# Patient Record
Sex: Female | Born: 1940 | Race: White | Hispanic: No | Smoking: Former smoker
Health system: Southern US, Community
[De-identification: ages and names within clinical notes are randomized; demographics above are authoritative.]

## PROBLEM LIST (undated history)

## (undated) DIAGNOSIS — C55 Malignant neoplasm of uterus, part unspecified: Secondary | ICD-10-CM

## (undated) DIAGNOSIS — I10 Essential (primary) hypertension: Secondary | ICD-10-CM

## (undated) DIAGNOSIS — D649 Anemia, unspecified: Secondary | ICD-10-CM

## (undated) DIAGNOSIS — E785 Hyperlipidemia, unspecified: Secondary | ICD-10-CM

## (undated) DIAGNOSIS — I519 Heart disease, unspecified: Secondary | ICD-10-CM

## (undated) DIAGNOSIS — K769 Liver disease, unspecified: Secondary | ICD-10-CM

## (undated) DIAGNOSIS — E079 Disorder of thyroid, unspecified: Secondary | ICD-10-CM

## (undated) HISTORY — DX: Malignant neoplasm of uterus, part unspecified: C55

## (undated) HISTORY — PX: SKIN CANCER EXCISION: SHX779

## (undated) HISTORY — DX: Heart disease, unspecified: I51.9

## (undated) HISTORY — DX: Liver disease, unspecified: K76.9

## (undated) HISTORY — DX: Disorder of thyroid, unspecified: E07.9

## (undated) HISTORY — DX: Anemia, unspecified: D64.9

## (undated) HISTORY — DX: Essential (primary) hypertension: I10

## (undated) HISTORY — PX: CATARACT EXTRACTION: SUR2

## (undated) HISTORY — DX: Hyperlipidemia, unspecified: E78.5

## (undated) HISTORY — PX: OTHER SURGICAL HISTORY: SHX169

---

## 2013-08-16 DIAGNOSIS — K7460 Unspecified cirrhosis of liver: Secondary | ICD-10-CM | POA: Insufficient documentation

## 2013-08-16 DIAGNOSIS — K7581 Nonalcoholic steatohepatitis (NASH): Secondary | ICD-10-CM | POA: Insufficient documentation

## 2013-08-16 DIAGNOSIS — E039 Hypothyroidism, unspecified: Secondary | ICD-10-CM | POA: Insufficient documentation

## 2013-08-16 DIAGNOSIS — I10 Essential (primary) hypertension: Secondary | ICD-10-CM | POA: Insufficient documentation

## 2013-09-27 DIAGNOSIS — R899 Unspecified abnormal finding in specimens from other organs, systems and tissues: Secondary | ICD-10-CM | POA: Insufficient documentation

## 2013-09-27 DIAGNOSIS — K439 Ventral hernia without obstruction or gangrene: Secondary | ICD-10-CM | POA: Insufficient documentation

## 2013-10-24 DIAGNOSIS — R188 Other ascites: Secondary | ICD-10-CM | POA: Insufficient documentation

## 2013-10-24 DIAGNOSIS — I81 Portal vein thrombosis: Secondary | ICD-10-CM | POA: Insufficient documentation

## 2017-12-01 DIAGNOSIS — I2510 Atherosclerotic heart disease of native coronary artery without angina pectoris: Secondary | ICD-10-CM | POA: Insufficient documentation

## 2017-12-01 DIAGNOSIS — R0600 Dyspnea, unspecified: Secondary | ICD-10-CM | POA: Insufficient documentation

## 2017-12-01 DIAGNOSIS — R0609 Other forms of dyspnea: Secondary | ICD-10-CM | POA: Insufficient documentation

## 2018-02-09 DIAGNOSIS — R600 Localized edema: Secondary | ICD-10-CM | POA: Insufficient documentation

## 2018-03-16 DIAGNOSIS — K922 Gastrointestinal hemorrhage, unspecified: Secondary | ICD-10-CM | POA: Insufficient documentation

## 2020-04-09 DIAGNOSIS — C55 Malignant neoplasm of uterus, part unspecified: Secondary | ICD-10-CM | POA: Insufficient documentation

## 2020-08-24 DIAGNOSIS — E871 Hypo-osmolality and hyponatremia: Secondary | ICD-10-CM | POA: Insufficient documentation

## 2020-09-06 DIAGNOSIS — K31819 Angiodysplasia of stomach and duodenum without bleeding: Secondary | ICD-10-CM | POA: Insufficient documentation

## 2020-09-27 DIAGNOSIS — D500 Iron deficiency anemia secondary to blood loss (chronic): Secondary | ICD-10-CM | POA: Insufficient documentation

## 2020-09-27 NOTE — Progress Notes (Signed)
[place]  [phone] Fax:(336) [phone]  ID: April Blake OB: [birth year]  MR#: [id number]  BRA#:[id number]  [name], [name]  CHIEF COMPLAINT: Anemia, unspecified.  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  All of her care has been received at [facility].  Because she requires intermittent blood transfusions, patient wishes to establish care closer to home for treatment.  She currently feels well and is at her baseline.  She has no neurologic complaints.  She denies any recent fevers or illnesses.  She has a fair appetite, but denies weight loss.  She has no chest pain, shortness of breath, cough, or hemoptysis.  She denies any nausea, vomiting, constipation, or diarrhea.  She has noted no recent melena or hematochezia.  She has no urinary complaints.  Patient offers no further specific complaints today.  REVIEW OF SYSTEMS:   Review of Systems  Constitutional:  Positive for malaise/fatigue. Negative for fever and weight loss.  Respiratory: Negative.  Negative for cough, hemoptysis and shortness of breath.   Cardiovascular: Negative.  Negative for chest pain and leg swelling.  Gastrointestinal: Negative.  Negative for abdominal pain, blood in stool and melena.  Genitourinary: Negative.  Negative for dysuria and hematuria.  Musculoskeletal: Negative.  Negative for back pain.  Skin: Negative.  Negative for rash.  Neurological:  Positive for weakness. Negative for dizziness, focal weakness and headaches.  Psychiatric/Behavioral: Negative.  The patient is not nervous/anxious.    As per HPI. Otherwise, a complete review of systems is negative.  PAST MEDICAL HISTORY: No past medical history on file.  PAST SURGICAL HISTORY: Reviewed and unchanged.  FAMILY HISTORY: No family history on  file.  ADVANCED DIRECTIVES (Y/N):  N  HEALTH MAINTENANCE: Social History   Tobacco Use   Smoking status: Former    Pack years: 0.00    Types: Cigarettes  Substance Use Topics   Alcohol use: Never   Drug use: Never     Colonoscopy:  PAP:  Bone density:  Lipid panel:  No Known Allergies  Current Outpatient Medications  Medication Sig Dispense Refill   ACCU-CHEK GUIDE test strip 1 each 4 (four) times daily.     acetaminophen (TYLENOL) 500 MG tablet Take by mouth.     albuterol (VENTOLIN HFA) 108 (90 Base) MCG/ACT inhaler 2 puffs as needed     bisacodyl (DULCOLAX) 5 MG EC tablet Take by mouth.     Budeson-Glycopyrrol-Formoterol (BREZTRI AEROSPHERE) 160-9-4.8 MCG/ACT AERO 2 puffs     Coenzyme Q10 (CO Q-10) 200 MG CAPS 1 capsule with a meal     Cysteamine Bitartrate (PROCYSBI) 300 MG PACK Use 1 each 3 (three) times daily Use as instructed.     ferrous sulfate 325 (65 FE) MG tablet 1 tablet     furosemide (LASIX) 20 MG tablet Take by mouth.     glucose blood (ACCU-CHEK AVIVA PLUS) test strip      glucose blood (ACCU-CHEK GUIDE) test strip Check BS 4/day -Fluctuating blood sugars     insulin glargine (LANTUS SOLOSTAR) 100 UNIT/ML Solostar Pen 20 units in the morning     insulin regular (NOVOLIN R) 100 units/mL injection See admin instructions.     levothyroxine (SYNTHROID) 75 MCG tablet 1 tablet in the morning on an empty stomach     omeprazole (PRILOSEC) 40 MG capsule 1 capsule 30 minutes before morning meal  ondansetron (ZOFRAN) 4 MG tablet 1 tablet as needed for nausea/vomiting     polyethylene glycol powder (GLYCOLAX/MIRALAX) 17 GM/SCOOP powder Take by mouth.     propranolol (INDERAL) 10 MG tablet 1 tablet on an empty stomach     simvastatin (ZOCOR) 20 MG tablet 1 tablet in the evening     spironolactone (ALDACTONE) 50 MG tablet Take by mouth.     traMADol (ULTRAM) 50 MG tablet 1-2 tablets as needed for pain     No current facility-administered medications for this  visit.    OBJECTIVE: Vitals:   09/30/20 1541  Resp: 20  Temp: (!) 97.3 F (36.3 C)  SpO2: 100%     There is no height or weight on file to calculate BMI.    ECOG FS:1 - Symptomatic but completely ambulatory  General: Well-developed, well-nourished, no acute distress. Eyes: Pink conjunctiva, anicteric sclera. HEENT: Normocephalic, moist mucous membranes. Lungs: No audible wheezing or coughing. Heart: Regular rate and rhythm. Abdomen: Soft, nontender, no obvious distention. Musculoskeletal: No edema, cyanosis, or clubbing. Neuro: Alert, answering all questions appropriately. Cranial nerves grossly intact. Skin: No rashes or petechiae noted. Psych: Normal affect. Lymphatics: No cervical, calvicular, axillary or inguinal LAD.   LAB RESULTS:  No results found for: NA, K, CL, CO2, GLUCOSE, BUN, CREATININE, CALCIUM, PROT, ALBUMIN, AST, ALT, ALKPHOS, BILITOT, GFRNONAA, GFRAA  Lab Results  Component Value Date   WBC 4.0 09/30/2020   NEUTROABS 3.1 09/30/2020   HGB 6.0 (L) 09/30/2020   HCT 19.6 (L) 09/30/2020   MCV 93.8 09/30/2020   PLT 145 (L) 09/30/2020   Lab Results  Component Value Date   IRON 34 09/30/2020   TIBC 437 09/30/2020   IRONPCTSAT 8 (L) 09/30/2020   Lab Results  Component Value Date   FERRITIN 19 09/30/2020     STUDIES: No results found.  ASSESSMENT: Anemia, unspecified.   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  We will continue to monitor patient's hemoglobin periodically and transfuse if falls below 7.0.  If patient's hemoglobin is between 7.0-8.0 we will base transfusion on patient's performance status.  If greater than 8.0, no transfusion will be necessary.  Her most recent hemoglobin is 6.0, therefore she will return to clinic tomorrow for 2 units of packed red blood cells.  Return to clinic monthly for laboratory work and consideration of transfusion.  Patient then return to clinic in 3 months for laboratory work, further  evaluation, and continuation of treatment if needed. NASH/GAVE syndrome: Continue follow-up and treatment at [facility].  I spent a total of 60 minutes reviewing chart data, face-to-face evaluation with the patient, counseling and coordination of care as detailed above.   Patient expressed understanding and was in agreement with this plan. She also understands that She can call clinic at any time with any questions, concerns, or complaints.    Lloyd Huger, MD   10/02/2020 4:58 PM

## 2020-09-30 ENCOUNTER — Encounter: Payer: Self-pay | Admitting: Oncology

## 2020-09-30 ENCOUNTER — Other Ambulatory Visit: Payer: Self-pay

## 2020-09-30 ENCOUNTER — Encounter (INDEPENDENT_AMBULATORY_CARE_PROVIDER_SITE_OTHER): Payer: Self-pay

## 2020-09-30 ENCOUNTER — Inpatient Hospital Stay: Payer: Medicare Other

## 2020-09-30 ENCOUNTER — Inpatient Hospital Stay: Payer: Medicare Other | Attending: Oncology | Admitting: Oncology

## 2020-09-30 DIAGNOSIS — K429 Umbilical hernia without obstruction or gangrene: Secondary | ICD-10-CM | POA: Insufficient documentation

## 2020-09-30 DIAGNOSIS — D649 Anemia, unspecified: Secondary | ICD-10-CM | POA: Diagnosis present

## 2020-09-30 DIAGNOSIS — M1710 Unilateral primary osteoarthritis, unspecified knee: Secondary | ICD-10-CM | POA: Insufficient documentation

## 2020-09-30 DIAGNOSIS — K7581 Nonalcoholic steatohepatitis (NASH): Secondary | ICD-10-CM | POA: Diagnosis not present

## 2020-09-30 DIAGNOSIS — R0989 Other specified symptoms and signs involving the circulatory and respiratory systems: Secondary | ICD-10-CM | POA: Insufficient documentation

## 2020-09-30 DIAGNOSIS — I8501 Esophageal varices with bleeding: Secondary | ICD-10-CM | POA: Insufficient documentation

## 2020-09-30 DIAGNOSIS — Z87891 Personal history of nicotine dependence: Secondary | ICD-10-CM | POA: Diagnosis not present

## 2020-09-30 DIAGNOSIS — K7460 Unspecified cirrhosis of liver: Secondary | ICD-10-CM | POA: Diagnosis not present

## 2020-09-30 DIAGNOSIS — G4700 Insomnia, unspecified: Secondary | ICD-10-CM | POA: Insufficient documentation

## 2020-09-30 DIAGNOSIS — E785 Hyperlipidemia, unspecified: Secondary | ICD-10-CM | POA: Insufficient documentation

## 2020-09-30 DIAGNOSIS — E1121 Type 2 diabetes mellitus with diabetic nephropathy: Secondary | ICD-10-CM | POA: Insufficient documentation

## 2020-09-30 DIAGNOSIS — E1169 Type 2 diabetes mellitus with other specified complication: Secondary | ICD-10-CM | POA: Insufficient documentation

## 2020-09-30 DIAGNOSIS — M179 Osteoarthritis of knee, unspecified: Secondary | ICD-10-CM | POA: Insufficient documentation

## 2020-09-30 DIAGNOSIS — Z6841 Body Mass Index (BMI) 40.0 and over, adult: Secondary | ICD-10-CM | POA: Insufficient documentation

## 2020-09-30 DIAGNOSIS — Z860100 Personal history of colon polyps, unspecified: Secondary | ICD-10-CM | POA: Insufficient documentation

## 2020-09-30 DIAGNOSIS — Z7409 Other reduced mobility: Secondary | ICD-10-CM | POA: Insufficient documentation

## 2020-09-30 DIAGNOSIS — R5383 Other fatigue: Secondary | ICD-10-CM | POA: Insufficient documentation

## 2020-09-30 DIAGNOSIS — R928 Other abnormal and inconclusive findings on diagnostic imaging of breast: Secondary | ICD-10-CM | POA: Insufficient documentation

## 2020-09-30 DIAGNOSIS — R269 Unspecified abnormalities of gait and mobility: Secondary | ICD-10-CM | POA: Insufficient documentation

## 2020-09-30 DIAGNOSIS — N1832 Chronic kidney disease, stage 3b: Secondary | ICD-10-CM | POA: Insufficient documentation

## 2020-09-30 LAB — CBC WITH DIFFERENTIAL/PLATELET
Abs Immature Granulocytes: 0.02 10*3/uL (ref 0.00–0.07)
Basophils Absolute: 0 10*3/uL (ref 0.0–0.1)
Basophils Relative: 1 %
Eosinophils Absolute: 0.2 10*3/uL (ref 0.0–0.5)
Eosinophils Relative: 4 %
HCT: 19.6 % — ABNORMAL LOW (ref 36.0–46.0)
Hemoglobin: 6 g/dL — ABNORMAL LOW (ref 12.0–15.0)
Immature Granulocytes: 1 %
Lymphocytes Relative: 7 %
Lymphs Abs: 0.3 10*3/uL — ABNORMAL LOW (ref 0.7–4.0)
MCH: 28.7 pg (ref 26.0–34.0)
MCHC: 30.6 g/dL (ref 30.0–36.0)
MCV: 93.8 fL (ref 80.0–100.0)
Monocytes Absolute: 0.4 10*3/uL (ref 0.1–1.0)
Monocytes Relative: 11 %
Neutro Abs: 3.1 10*3/uL (ref 1.7–7.7)
Neutrophils Relative %: 76 %
Platelets: 145 10*3/uL — ABNORMAL LOW (ref 150–400)
RBC: 2.09 MIL/uL — ABNORMAL LOW (ref 3.87–5.11)
RDW: 16.1 % — ABNORMAL HIGH (ref 11.5–15.5)
WBC: 4 10*3/uL (ref 4.0–10.5)
nRBC: 0 % (ref 0.0–0.2)

## 2020-09-30 LAB — IRON AND TIBC
Iron: 34 ug/dL (ref 28–170)
Saturation Ratios: 8 % — ABNORMAL LOW (ref 10.4–31.8)
TIBC: 437 ug/dL (ref 250–450)
UIBC: 403 ug/dL

## 2020-09-30 LAB — SAMPLE TO BLOOD BANK

## 2020-09-30 LAB — FERRITIN: Ferritin: 19 ng/mL (ref 11–307)

## 2020-09-30 NOTE — Progress Notes (Signed)
Patient here today for new evaluation regarding anemia. Patients daughter reports she needs blood transfusion every 3 months, hgb typically 7.5-7.9.

## 2020-10-01 ENCOUNTER — Telehealth: Payer: Self-pay | Admitting: *Deleted

## 2020-10-01 ENCOUNTER — Other Ambulatory Visit: Payer: Self-pay | Admitting: Oncology

## 2020-10-01 DIAGNOSIS — D649 Anemia, unspecified: Secondary | ICD-10-CM

## 2020-10-01 NOTE — Addendum Note (Signed)
Addended by: Vanice Sarah on: 10/01/2020 02:53 PM   Modules accepted: Orders

## 2020-10-01 NOTE — Telephone Encounter (Signed)
Ginger (POA) called stating that she was to have gotten a call with patient results and she has not heard from [place]   CBC with Differential/Platelet Order: [id number] Status: Final result   Visible to patient: No (inaccessible in MyChart)   Next appt: 10/14/2020 at 10:00 AM in Internal Medicine Nobie Putnam, DO)   Dx: Anemia, unspecified type   0 Result Notes  Component Ref Range & Units 1 d ago   WBC 4.0 - 10.5 K/uL 4.0   RBC 3.87 - 5.11 MIL/uL 2.09 Low    Hemoglobin 12.0 - 15.0 g/dL 6.0 Low    HCT 36.0 - 46.0 % 19.6 Low    MCV 80.0 - 100.0 fL 93.8   MCH 26.0 - 34.0 pg 28.7   MCHC 30.0 - 36.0 g/dL 30.6   RDW 11.5 - 15.5 % 16.1 High    Platelets 150 - 400 K/uL 145 Low    nRBC 0.0 - 0.2 % 0.0   Neutrophils Relative % % 76   Neutro Abs 1.7 - 7.7 K/uL 3.1   Lymphocytes Relative % 7   Lymphs Abs 0.7 - 4.0 K/uL 0.3 Low    Monocytes Relative % 11   Monocytes Absolute 0.1 - 1.0 K/uL 0.4   Eosinophils Relative % 4   Eosinophils Absolute 0.0 - 0.5 K/uL 0.2   Basophils Relative % 1   Basophils Absolute 0.0 - 0.1 K/uL 0.0   Immature Granulocytes % 1   Abs Immature Granulocytes 0.00 - 0.07 K/uL 0.02   Comment: Performed at [facility], [address] [place], [place] [address]  Resulting Agency  [facility]         Specimen Collected: 09/30/20 16:07 Last Resulted: 09/30/20 16:30      Lab Flowsheet    Order Details    View Encounter    Lab and Collection Details    Routing    Result History    View Encounter Conversation         Result Care Coordination    Patient Communication   Add Comments   Not seen Back to Top        Other Results from 09/30/2020    Contains abnormal data Iron and TIBC Order: [id number] Status: Final result   Visible to patient: No (inaccessible in MyChart)   Next appt: 10/14/2020 at 10:00 AM in Internal Medicine Nobie Putnam, DO)   Dx: Anemia, unspecified type   0 Result Notes  Component Ref Range & Units 1 d  ago   Iron 28 - 170 ug/dL 34   TIBC 250 - 450 ug/dL 437   Saturation Ratios 10.4 - 31.8 % 8 Low    UIBC ug/dL 403   Comment: Performed at [facility], [address] [place], [place] [address]  Resulting Agency  [facility] LAB         Specimen Collected: 09/30/20 16:07 Last Resulted: 09/30/20 17:39      Lab Flowsheet    Order Details    View Encounter    Lab and Collection Details    Routing    Result History    View Encounter Conversation         Result Care Coordination    Patient Communication   Add Comments   Not seen Back to Top         Ferritin Order: [id number] Status: Final result    Visible to patient: No (inaccessible in [place])    Next appt: 10/14/2020 at 10:00  AM in Internal Medicine Nobie Putnam, DO)    Dx: Anemia, unspecified type    0 Result Notes   Component Ref Range & Units 1 d ago   Ferritin 11 - 307 ng/mL 19   Comment: Performed at [facility], [address] [place], [place] [address]  Resulting Agency  [facility]          Specimen Collected: 09/30/20 16:07 Last Resulted: 09/30/20 17:39      Lab Flowsheet     Order Details     View Encounter     Lab and Collection Details     Routing     Result History     View Encounter Conversation         Result Care Coordination    Patient Communication   Add Comments   Not seen Back to Top          Hold Tube- Blood Bank Order: [id number] Status: Final result    Visible to patient: No (inaccessible in MyChart)    Next appt: 10/14/2020 at 10:00 AM in Internal Medicine Nobie Putnam, DO)    Dx: Anemia, unspecified type    0 Result Notes   Component 1 d ago   Blood Bank Specimen SAMPLE AVAILABLE FOR TESTING   Sample Expiration 10/03/2020,2359  Performed at [facility], [address] [place], [place] [address]   Resulting Agency [facility] LAB          Specimen Collected: 09/30/20 16:07 Last Resulted:  09/30/20 18:04

## 2020-10-01 NOTE — Telephone Encounter (Signed)
Patient needs 2 units of blood, we drew hold tube yesterday. When can she get blood this week?

## 2020-10-01 NOTE — Addendum Note (Signed)
Addended by: Vanice Sarah on: 10/01/2020 04:01 PM   Modules accepted: Orders

## 2020-10-01 NOTE — Telephone Encounter (Signed)
Patient is scheduled for blood transfusion tomorrow @ 9:00.  Ginger is aware and advised her to make sure pt does NOT remove the blood arm band that was placed yesterday.

## 2020-10-02 ENCOUNTER — Other Ambulatory Visit: Payer: Self-pay

## 2020-10-02 ENCOUNTER — Inpatient Hospital Stay: Payer: Medicare Other

## 2020-10-02 DIAGNOSIS — D649 Anemia, unspecified: Secondary | ICD-10-CM | POA: Diagnosis not present

## 2020-10-02 LAB — ABO/RH: ABO/RH(D): A POS

## 2020-10-02 LAB — PREPARE RBC (CROSSMATCH)

## 2020-10-02 MED ORDER — SODIUM CHLORIDE 0.9% IV SOLUTION
250.0000 mL | Freq: Once | INTRAVENOUS | Status: AC
  Administered 2020-10-02: 250 mL via INTRAVENOUS
  Filled 2020-10-02: qty 250

## 2020-10-02 MED ORDER — DIPHENHYDRAMINE HCL 50 MG/ML IJ SOLN
25.0000 mg | Freq: Once | INTRAMUSCULAR | Status: AC
  Administered 2020-10-02: 25 mg via INTRAVENOUS
  Filled 2020-10-02: qty 1

## 2020-10-02 MED ORDER — ACETAMINOPHEN 325 MG PO TABS
650.0000 mg | ORAL_TABLET | Freq: Once | ORAL | Status: AC
  Administered 2020-10-02: 650 mg via ORAL
  Filled 2020-10-02: qty 2

## 2020-10-02 NOTE — Patient Instructions (Signed)
https://www.redcrossblood.org/donate-blood/blood-donation-process/what-happens-to-donated-blood/blood-transfusions/types-of-blood-transfusions.html"> https://www.hematology.org/education/patients/blood-basics/blood-safety-and-matching"> https://www.nhlbi.nih.gov/health-topics/blood-transfusion">  Blood Transfusion, Adult A blood transfusion is a procedure in which you receive blood or a type of blood cell (blood component) through an IV. You may need a blood transfusion when your blood level is low. This may result from a bleeding disorder, illness, injury, or surgery. The blood may come from a donor. You may also be able to donate blood for yourself (autologous blood donation) before a planned surgery. The blood given in a transfusion is made up of different blood components. You may receive: Red blood cells. These carry oxygen to the cells in the body. Platelets. These help your blood to clot. Plasma. This is the liquid part of your blood. It carries proteins and other substances throughout the body. White blood cells. These help you fight infections. If you have hemophilia or another clotting disorder, you may also receive othertypes of blood products. Tell a health care provider about: Any blood disorders you have. Any previous reactions you have had during a blood transfusion. Any allergies you have. All medicines you are taking, including vitamins, herbs, eye drops, creams, and over-the-counter medicines. Any surgeries you have had. Any medical conditions you have, including any recent fever or cold symptoms. Whether you are pregnant or may be pregnant. What are the risks? Generally, this is a safe procedure. However, problems may occur. The most common problems include: A mild allergic reaction, such as red, swollen areas of skin (hives) and itching. Fever or chills. This may be the body's response to new blood cells received. This may occur during or up to 4 hours after the  transfusion. More serious problems may include: Transfusion-associated circulatory overload (TACO), or too much fluid in the lungs. This may cause breathing problems. A serious allergic reaction, such as difficulty breathing or swelling around the face and lips. Transfusion-related acute lung injury (TRALI), which causes breathing difficulty and low oxygen in the blood. This can occur within hours of the transfusion or several days later. Iron overload. This can happen after receiving many blood transfusions over a period of time. Infection or virus being transmitted. This is rare because donated blood is carefully tested before it is given. Hemolytic transfusion reaction. This is rare. It happens when your body's defense system (immune system)tries to attack the new blood cells. Symptoms may include fever, chills, nausea, low blood pressure, and low back or chest pain. Transfusion-associated graft-versus-host disease (TAGVHD). This is rare. It happens when donated cells attack your body's healthy tissues. What happens before the procedure? Medicines Ask your health care provider about: Changing or stopping your regular medicines. This is especially important if you are taking diabetes medicines or blood thinners. Taking medicines such as aspirin and ibuprofen. These medicines can thin your blood. Do not take these medicines unless your health care provider tells you to take them. Taking over-the-counter medicines, vitamins, herbs, and supplements. General instructions Follow instructions from your health care provider about eating and drinking restrictions. You will have a blood test to determine your blood type. This is necessary to know what kind of blood your body will accept and to match it to the donor blood. If you are going to have a planned surgery, you may be able to do an autologous blood donation. This may be done in case you need to have a transfusion. You will have your temperature,  blood pressure, and pulse monitored before the transfusion. If you have had an allergic reaction to a transfusion in the past, you may be given   medicine to help prevent a reaction. This medicine may be given to you by mouth (orally) or through an IV. Set aside time for the blood transfusion. This procedure generally takes 1-4 hours to complete. What happens during the procedure?  An IV will be inserted into one of your veins. The bag of donated blood will be attached to your IV. The blood will then enter through your vein. Your temperature, blood pressure, and pulse will be monitored regularly during the transfusion. This monitoring is done to detect early signs of a transfusion reaction. Tell your nurse right away if you have any of these symptoms during the transfusion: Shortness of breath or trouble breathing. Chest or back pain. Fever or chills. Hives or itching. If you have any signs or symptoms of a reaction, your transfusion will be stopped and you may be given medicine. When the transfusion is complete, your IV will be removed. Pressure may be applied to the IV site for a few minutes. A bandage (dressing)will be applied. The procedure may vary among health care providers and hospitals. What happens after the procedure? Your temperature, blood pressure, pulse, breathing rate, and blood oxygen level will be monitored until you leave the hospital or clinic. Your blood may be tested to see how you are responding to the transfusion. You may be warmed with fluids or blankets to maintain a normal body temperature. If you receive your blood transfusion in an outpatient setting, you will be told whom to contact to report any reactions. Where to find more information For more information on blood transfusions, visit the American Red Cross: redcross.org Summary A blood transfusion is a procedure in which you receive blood or a type of blood cell (blood component) through an IV. The blood you  receive may come from a donor or be donated by yourself (autologous blood donation) before a planned surgery. The blood given in a transfusion is made up of different blood components. You may receive red blood cells, platelets, plasma, or white blood cells depending on the condition treated. Your temperature, blood pressure, and pulse will be monitored before, during, and after the transfusion. After the transfusion, your blood may be tested to see how your body has responded. This information is not intended to replace advice given to you by your health care provider. Make sure you discuss any questions you have with your healthcare provider. Document Revised: 01/25/2019 Document Reviewed: 09/14/2018 Elsevier Patient Education  2022 [employer]  

## 2020-10-03 LAB — BPAM RBC
Blood Product Expiration Date: 202208012359
Blood Product Expiration Date: 202208012359
ISSUE DATE / TIME: 202206301007
ISSUE DATE / TIME: 202206301219
Unit Type and Rh: 6200
Unit Type and Rh: 6200

## 2020-10-03 LAB — TYPE AND SCREEN
ABO/RH(D): A POS
Antibody Screen: NEGATIVE
Unit division: 0
Unit division: 0

## 2020-10-14 ENCOUNTER — Other Ambulatory Visit: Payer: Self-pay

## 2020-10-14 ENCOUNTER — Encounter: Payer: Self-pay | Admitting: Family Medicine

## 2020-10-14 ENCOUNTER — Ambulatory Visit (INDEPENDENT_AMBULATORY_CARE_PROVIDER_SITE_OTHER): Payer: Medicare Other | Admitting: Family Medicine

## 2020-10-14 VITALS — BP 119/42 | HR 64 | Ht 63.0 in | Wt 174.6 lb

## 2020-10-14 DIAGNOSIS — E1121 Type 2 diabetes mellitus with diabetic nephropathy: Secondary | ICD-10-CM | POA: Diagnosis not present

## 2020-10-14 DIAGNOSIS — N1832 Chronic kidney disease, stage 3b: Secondary | ICD-10-CM

## 2020-10-14 DIAGNOSIS — R600 Localized edema: Secondary | ICD-10-CM

## 2020-10-14 DIAGNOSIS — E785 Hyperlipidemia, unspecified: Secondary | ICD-10-CM

## 2020-10-14 DIAGNOSIS — I8511 Secondary esophageal varices with bleeding: Secondary | ICD-10-CM

## 2020-10-14 DIAGNOSIS — C541 Malignant neoplasm of endometrium: Secondary | ICD-10-CM

## 2020-10-14 DIAGNOSIS — K7581 Nonalcoholic steatohepatitis (NASH): Secondary | ICD-10-CM

## 2020-10-14 DIAGNOSIS — I10 Essential (primary) hypertension: Secondary | ICD-10-CM | POA: Diagnosis not present

## 2020-10-14 DIAGNOSIS — E039 Hypothyroidism, unspecified: Secondary | ICD-10-CM

## 2020-10-14 DIAGNOSIS — D500 Iron deficiency anemia secondary to blood loss (chronic): Secondary | ICD-10-CM

## 2020-10-14 DIAGNOSIS — M170 Bilateral primary osteoarthritis of knee: Secondary | ICD-10-CM

## 2020-10-14 DIAGNOSIS — E1169 Type 2 diabetes mellitus with other specified complication: Secondary | ICD-10-CM

## 2020-10-14 DIAGNOSIS — K31819 Angiodysplasia of stomach and duodenum without bleeding: Secondary | ICD-10-CM

## 2020-10-14 DIAGNOSIS — K7460 Unspecified cirrhosis of liver: Secondary | ICD-10-CM

## 2020-10-14 DIAGNOSIS — K429 Umbilical hernia without obstruction or gangrene: Secondary | ICD-10-CM

## 2020-10-14 LAB — POCT UA - MICROALBUMIN: Microalbumin Ur, POC: NEGATIVE mg/L

## 2020-10-14 MED ORDER — TRAMADOL HCL 50 MG PO TABS: 50.0000 mg | ORAL_TABLET | Freq: Every evening | ORAL | 1 refills | Status: DC | PRN

## 2020-10-14 MED ORDER — DICLOFENAC SODIUM 1 % EX GEL: 2.0000 g | Freq: Four times a day (QID) | CUTANEOUS | 2 refills | Status: AC | PRN

## 2020-10-14 MED ORDER — SIMVASTATIN 20 MG PO TABS: 20.0000 mg | ORAL_TABLET | Freq: Every day | ORAL | 3 refills | Status: DC

## 2020-10-14 NOTE — Progress Notes (Signed)
Subjective:    Patient ID: April Blake, female    DOB: 1941/03/21, 80 y.o.   MRN: [id number]  April Blake is a 80 y.o. female presenting on 10/14/2020 for [place] to [facility] to live with daughter Manuela Schwartz in November for holidays.  Here with daughter, Ginger.  HPI  History of hernia abnormality, ultimately found Uterine Cancer, and cyst on ovary.  [facility] - Dr Shary Key Radiation Oncology - Dr Deliah Goody Liver/Hepatology - Dr Merrilee Jansky  History since 2007 with esophageal varices, and liver cirrhosis. Lower extremity edema On Lasix 60mg  daily and Spironolactone 100mg  daily with good results on fluid.  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 Usually will get 2 PRBC units every 3 months or sooner.  Type 2 Diabetes, chronic For >20 years, on Insulin regimen currently Awaiting new apt w/ Dr Gabriel Carina [facility] Endocrinology 10/24/20 On Lantus 20u daily and Novolin R SSI History of cataract, hyperglycemia, and CKD-III  Osteoarthritis, bilateral knees Chronic pain On Tramadol in past for pain episodic flare up, usually occasionally at night to help sleep with knee and joint pain In wheelchair PRN Needs handicap placard form signed.  Hyperlipidemia Needs re order Simvastatin.  Hypothyroidism On chronic levothyroxine Levothyroxine 55mcg daily  Depression screen [facility] 2/9 10/14/2020  Decreased Interest 0  Down, Depressed, Hopeless 0  PHQ - 2 Score 0    Past Medical History:  Diagnosis Date   Anemia    Heart disease    Hyperlipidemia    Hypertension    Liver disease    Thyroid disease    Uterine cancer ([place])    Past Surgical History:  Procedure Laterality Date   CATARACT EXTRACTION     cataract surg     SKIN CANCER EXCISION     skin cancer removal     surgery to left leg     Social History   Socioeconomic History   Marital status: Widowed    Spouse name: Not on file    Number of children: Not on file   Years of education: Not on file   Highest education level: Not on file  Occupational History   Not on file  Tobacco Use   Smoking status: Former    Packs/day: 0.20    Years: 5.00    Pack years: 1.00    Types: Cigarettes   Smokeless tobacco: Never  Substance and Sexual Activity   Alcohol use: Never   Drug use: Never   Sexual activity: Not on file  Other Topics Concern   Not on file  Social History Narrative   Not on file   Social Determinants of Health   Financial Resource Strain: Not on file  Food Insecurity: Not on file  Transportation Needs: Not on file  Physical Activity: Not on file  Stress: Not on file  Social Connections: Not on file  Intimate Partner Violence: Not on file   History reviewed. No pertinent family history. Current Outpatient Medications on File Prior to Visit  Medication Sig   ACCU-CHEK GUIDE test strip 1 each 4 (four) times daily.   bisacodyl (DULCOLAX) 5 MG EC tablet Take by mouth.   Budeson-Glycopyrrol-Formoterol (BREZTRI AEROSPHERE) 160-9-4.8 MCG/ACT AERO 2 puffs   Coenzyme Q10 (CO Q-10) 200 MG CAPS 1 capsule with a meal   Cysteamine Bitartrate (PROCYSBI) 300 MG PACK Use 1 each 3 (three) times daily Use as instructed.   ferrous sulfate 325 (  65 FE) MG tablet 1 tablet   furosemide (LASIX) 20 MG tablet Take 60 mg by mouth daily.   glucose blood (ACCU-CHEK AVIVA PLUS) test strip    glucose blood (ACCU-CHEK GUIDE) test strip Check BS 4/day -Fluctuating blood sugars   insulin glargine (LANTUS SOLOSTAR) 100 UNIT/ML Solostar Pen 20 units in the morning   insulin regular (NOVOLIN R) 100 units/mL injection See admin instructions.   levothyroxine (SYNTHROID) 75 MCG tablet 1 tablet in the morning on an empty stomach   omeprazole (PRILOSEC) 40 MG capsule Take 40 mg by mouth 2 (two) times daily before a meal.   polyethylene glycol powder (GLYCOLAX/MIRALAX) 17 GM/SCOOP powder Take by mouth.   propranolol (INDERAL) 10 MG  tablet 1 tablet on an empty stomach   spironolactone (ALDACTONE) 50 MG tablet Take 100 mg by mouth daily.   acetaminophen (TYLENOL) 500 MG tablet Take by mouth. (Patient not taking: Reported on 10/14/2020)   albuterol (VENTOLIN HFA) 108 (90 Base) MCG/ACT inhaler 2 puffs as needed (Patient not taking: Reported on 10/14/2020)   ondansetron (ZOFRAN) 4 MG tablet 1 tablet as needed for nausea/vomiting (Patient not taking: Reported on 10/14/2020)   No current facility-administered medications on file prior to visit.    Review of Systems Per HPI unless specifically indicated above      Objective:    BP (!) 119/42   Pulse 64   Ht 5\' 3"  (1.6 m)   Wt 174 lb 9.6 oz (79.2 kg)   SpO2 100%   BMI 30.93 kg/m   Wt Readings from Last 3 Encounters:  10/14/20 174 lb 9.6 oz (79.2 kg)  09/30/20 185 lb 8 oz (84.1 kg)     Physical Exam Vitals and nursing note reviewed.  Constitutional:      General: She is not in acute distress.    Appearance: She is well-developed. She is not diaphoretic.     Comments: Well-appearing, comfortable, cooperative  HENT:     Head: Normocephalic and atraumatic.  Eyes:     General:        Right eye: No discharge.        Left eye: No discharge.     Conjunctiva/sclera: Conjunctivae normal.  Neck:     Thyroid: No thyromegaly.  Cardiovascular:     Rate and Rhythm: Normal rate and regular rhythm.     Heart sounds: Normal heart sounds. No murmur heard. Pulmonary:     Effort: Pulmonary effort is normal. No respiratory distress.     Breath sounds: Normal breath sounds. No wheezing or rales.  Musculoskeletal:        General: Normal range of motion.     Cervical back: Normal range of motion and neck supple.     Right lower leg: Edema (+1) present.     Left lower leg: Edema (+1) present.     Comments: In wheelchair  Lymphadenopathy:     Cervical: No cervical adenopathy.  Skin:    General: Skin is warm and dry.     Findings: No erythema or rash.  Neurological:      Mental Status: She is alert and oriented to person, place, and time.  Psychiatric:        Behavior: Behavior normal.     Comments: Well groomed, good eye contact, normal speech and thoughts    Diabetic Foot Exam - Simple   Simple Foot Form Diabetic Foot exam was performed with the following findings: Yes 10/14/2020 10:52 AM  Visual Inspection No deformities, no ulcerations,  no other skin breakdown bilaterally: Yes Sensation Testing Intact to touch and monofilament testing bilaterally: Yes Pulse Check Posterior Tibialis and Dorsalis pulse intact bilaterally: Yes Comments      Results for orders placed or performed in visit on 10/14/20  POCT UA - Microalbumin  Result Value Ref Range   Microalbumin Ur, POC Negative mg/L   Creatinine, POC     Albumin/Creatinine Ratio, Urine, POC        Assessment & Plan:   Problem List Items Addressed This Visit     Uterine cancer ([place])   Umbilical hernia   Type 2 diabetes with nephropathy (HCC)   Relevant Medications   simvastatin (ZOCOR) 20 MG tablet   Other Relevant Orders   POCT UA - Microalbumin (Completed)   Liver cirrhosis secondary to NASH ([place])   Hypothyroidism   Hyperlipidemia associated with type 2 diabetes mellitus (HCC)   Relevant Medications   simvastatin (ZOCOR) 20 MG tablet   GAVE (gastric antral vascular ectasia)   Relevant Medications   simvastatin (ZOCOR) 20 MG tablet   furosemide (LASIX) 20 MG tablet   Essential hypertension - Primary   Relevant Medications   simvastatin (ZOCOR) 20 MG tablet   furosemide (LASIX) 20 MG tablet   Esophageal varices with bleeding (HCC)   Relevant Medications   simvastatin (ZOCOR) 20 MG tablet   furosemide (LASIX) 20 MG tablet   Chronic kidney disease, stage 3b (HCC)   Chronic blood loss anemia   Bilateral lower extremity edema   Other Visit Diagnoses     Bilateral primary osteoarthritis of knee       Relevant Medications   traMADol (ULTRAM) 50 MG tablet   diclofenac Sodium  (VOLTAREN) 1 % GEL       Cirrhosis Esophageal Varices Followed by Dr Guerry Minors Hepatology On med regimen Furosemide/Spironolactone  Type 2 DM Complications CKD-IIIb Cataracts New apt w/ Dr Gabriel Carina [facility] Endocrine 7/22 Continue Lantus 20u and SSI Novolin R regimen no change today DM Foot check Urine Microalbumin POC today Will request copy of DM eye last exam  Osteoarthritis bilateral knee Chronic Joint Pain Reviewed PDMP Agree to renew existing Tramadol rx PRN use only not regular use, 30 pill for few months Trial topical Voltaren, safe for topical use Follow-up as needed  Uterine Cancer S/p Radiation therapy On Surveillance for further management Not candidate for surgery previously  Chronic Blood Loss Anemia GAVE Followed by Dr Grayland Ormond [facility] Heme/Onc Receives PRBC Transfusion when indicated.  Hypothyroidism On Levothyroxine  HLD On Simvastatin, refill  Meds ordered this encounter  Medications   simvastatin (ZOCOR) 20 MG tablet    Sig: Take 1 tablet (20 mg total) by mouth at bedtime.    Dispense:  90 tablet    Refill:  3   traMADol (ULTRAM) 50 MG tablet    Sig: Take 1 tablet (50 mg total) by mouth at bedtime as needed for moderate pain.    Dispense:  30 tablet    Refill:  1   diclofenac Sodium (VOLTAREN) 1 % GEL    Sig: Apply 2 g topically 4 (four) times daily as needed (osteoarthritis knee pain).    Dispense:  100 g    Refill:  2      Follow up plan: Return in about 6 months (around 04/16/2021) for Follow-up 6 month AM apt, fasting can do labs (f/u DM, HLD, Heme/Onc/Liver updates).  Nobie Putnam, [facility] 10/14/2020, 10:29 AM

## 2020-10-14 NOTE — Patient Instructions (Addendum)
Thank you for coming to the office today.  Keep up the good work.  Refilled Simvastatin, Tramadol  Sent generic Voltaren to pharmacy  START anti inflammatory topical - OTC Voltaren (generic Diclofenac) topical 2-4 times a day as needed for pain swelling of affected joint for 1-2 weeks or longer.  Urine test today for protein.  Try to get [place] a copy of last diabetic eye exam faxed [phone]   Please schedule a Follow-up Appointment to: Return in about 6 months (around 04/16/2021) for Follow-up 6 month AM apt, fasting can do labs (f/u DM, HLD, Heme/Onc/Liver updates).  If you have any other questions or concerns, please feel free to call the office or send a message through [place]. You may also schedule an earlier appointment if necessary.  Additionally, you may be receiving a survey about your experience at our office within a few days to 1 week by e-mail or mail. We value your feedback.  Nobie Putnam, DO [place]

## 2020-10-20 ENCOUNTER — Telehealth: Payer: Self-pay | Admitting: Oncology

## 2020-10-20 DIAGNOSIS — D649 Anemia, unspecified: Secondary | ICD-10-CM

## 2020-10-20 NOTE — Telephone Encounter (Signed)
Patients daughter called and states Dr. Grayland Ormond told her to bring her mother in whenever they felt her hemaglobin was getting low.  Patient is coming tomorrow at 915 am.  Routing to clinical team to make sure orders are in.

## 2020-10-20 NOTE — Telephone Encounter (Signed)
Called April Blake for more details of patient's symptoms.  Patient is moving slower, decreased appetite, sleeping more, and "just doesn't feel right".  These are signs and symptoms she had last time she needed a blood transfusion.  Patient was scheduled for labs tomorrow.

## 2020-10-21 ENCOUNTER — Other Ambulatory Visit: Payer: Self-pay | Admitting: Oncology

## 2020-10-21 ENCOUNTER — Inpatient Hospital Stay: Payer: Medicare Other | Attending: Oncology

## 2020-10-21 DIAGNOSIS — D649 Anemia, unspecified: Secondary | ICD-10-CM

## 2020-10-21 LAB — CBC WITH DIFFERENTIAL/PLATELET
Abs Immature Granulocytes: 0.01 10*3/uL (ref 0.00–0.07)
Basophils Absolute: 0 10*3/uL (ref 0.0–0.1)
Basophils Relative: 0 %
Eosinophils Absolute: 0.1 10*3/uL (ref 0.0–0.5)
Eosinophils Relative: 5 %
HCT: 19.9 % — ABNORMAL LOW (ref 36.0–46.0)
Hemoglobin: 6.1 g/dL — ABNORMAL LOW (ref 12.0–15.0)
Immature Granulocytes: 0 %
Lymphocytes Relative: 8 %
Lymphs Abs: 0.2 10*3/uL — ABNORMAL LOW (ref 0.7–4.0)
MCH: 28.4 pg (ref 26.0–34.0)
MCHC: 30.7 g/dL (ref 30.0–36.0)
MCV: 92.6 fL (ref 80.0–100.0)
Monocytes Absolute: 0.4 10*3/uL (ref 0.1–1.0)
Monocytes Relative: 12 %
Neutro Abs: 2.2 10*3/uL (ref 1.7–7.7)
Neutrophils Relative %: 75 %
Platelets: 138 10*3/uL — ABNORMAL LOW (ref 150–400)
RBC: 2.15 MIL/uL — ABNORMAL LOW (ref 3.87–5.11)
RDW: 16.1 % — ABNORMAL HIGH (ref 11.5–15.5)
WBC: 2.9 10*3/uL — ABNORMAL LOW (ref 4.0–10.5)
nRBC: 0 % (ref 0.0–0.2)

## 2020-10-21 LAB — SAMPLE TO BLOOD BANK

## 2020-10-21 LAB — PREPARE RBC (CROSSMATCH)

## 2020-10-21 NOTE — Telephone Encounter (Signed)
Patient will be scheduled for 2 units of blood tomorrow.  April Blake also would like to ask if Dr. Grayland Ormond thinks an iron infusion would be beneficial.

## 2020-10-21 NOTE — Addendum Note (Signed)
Addended by: Vanice Sarah on: 10/21/2020 11:29 AM   Modules accepted: Orders

## 2020-10-22 ENCOUNTER — Inpatient Hospital Stay: Payer: Medicare Other

## 2020-10-22 DIAGNOSIS — D649 Anemia, unspecified: Secondary | ICD-10-CM | POA: Diagnosis not present

## 2020-10-22 MED ORDER — ACETAMINOPHEN 325 MG PO TABS
650.0000 mg | ORAL_TABLET | Freq: Once | ORAL | Status: AC
  Administered 2020-10-22: 650 mg via ORAL
  Filled 2020-10-22: qty 2

## 2020-10-22 MED ORDER — SODIUM CHLORIDE 0.9% IV SOLUTION
250.0000 mL | Freq: Once | INTRAVENOUS | Status: AC
  Administered 2020-10-22: 250 mL via INTRAVENOUS
  Filled 2020-10-22: qty 250

## 2020-10-22 MED ORDER — SODIUM CHLORIDE 0.9% FLUSH
10.0000 mL | INTRAVENOUS | Status: DC | PRN
  Filled 2020-10-22: qty 10

## 2020-10-22 MED ORDER — DIPHENHYDRAMINE HCL 50 MG/ML IJ SOLN
25.0000 mg | Freq: Once | INTRAMUSCULAR | Status: AC
  Administered 2020-10-22: 25 mg via INTRAVENOUS
  Filled 2020-10-22: qty 1

## 2020-10-22 NOTE — Progress Notes (Signed)
Pt tolerated blood transfusion with no problems or complaints.  Pt left infusion suite stable in a wheelchair.

## 2020-10-22 NOTE — Telephone Encounter (Signed)
Ginger informed.  She has also contacted [facility] hematology to get EGD scheduled

## 2020-10-23 LAB — BPAM RBC
Blood Product Expiration Date: 202207212359
Blood Product Expiration Date: 202207212359
ISSUE DATE / TIME: 202207200921
ISSUE DATE / TIME: 202207201054
Unit Type and Rh: 6200
Unit Type and Rh: 6200

## 2020-10-23 LAB — TYPE AND SCREEN
ABO/RH(D): A POS
Antibody Screen: NEGATIVE
Unit division: 0
Unit division: 0

## 2020-10-28 ENCOUNTER — Telehealth: Payer: Self-pay | Admitting: Family Medicine

## 2020-10-28 NOTE — Telephone Encounter (Signed)
Marland Mcalpine, from [place], calling stating that she is needing to verify PCP will be sign HH order moving forward for pt. Please advise.       [phone]

## 2020-10-29 ENCOUNTER — Other Ambulatory Visit: Payer: Self-pay

## 2020-10-29 DIAGNOSIS — D649 Anemia, unspecified: Secondary | ICD-10-CM

## 2020-10-29 NOTE — Telephone Encounter (Signed)
Okay to proceed home health will sign orders  Nobie Putnam, [facility] 10/29/2020, 12:09 PM

## 2020-10-29 NOTE — Telephone Encounter (Signed)
Shona Needles has called back call not returned yesterday

## 2020-10-29 NOTE — Telephone Encounter (Signed)
April Blake is aware. She said they will be faxing some paperwork to [place] once it has been completed.

## 2020-10-30 ENCOUNTER — Other Ambulatory Visit: Payer: Self-pay | Admitting: Oncology

## 2020-10-30 ENCOUNTER — Other Ambulatory Visit: Payer: Self-pay

## 2020-10-30 ENCOUNTER — Inpatient Hospital Stay: Payer: Medicare Other

## 2020-10-30 DIAGNOSIS — D649 Anemia, unspecified: Secondary | ICD-10-CM

## 2020-10-30 LAB — CBC WITH DIFFERENTIAL/PLATELET
Abs Immature Granulocytes: 0.02 10*3/uL (ref 0.00–0.07)
Basophils Absolute: 0 10*3/uL (ref 0.0–0.1)
Basophils Relative: 1 %
Eosinophils Absolute: 0.2 10*3/uL (ref 0.0–0.5)
Eosinophils Relative: 4 %
HCT: 22.3 % — ABNORMAL LOW (ref 36.0–46.0)
Hemoglobin: 6.9 g/dL — ABNORMAL LOW (ref 12.0–15.0)
Immature Granulocytes: 1 %
Lymphocytes Relative: 5 %
Lymphs Abs: 0.2 10*3/uL — ABNORMAL LOW (ref 0.7–4.0)
MCH: 28.9 pg (ref 26.0–34.0)
MCHC: 30.9 g/dL (ref 30.0–36.0)
MCV: 93.3 fL (ref 80.0–100.0)
Monocytes Absolute: 0.4 10*3/uL (ref 0.1–1.0)
Monocytes Relative: 11 %
Neutro Abs: 2.8 10*3/uL (ref 1.7–7.7)
Neutrophils Relative %: 78 %
Platelets: 136 10*3/uL — ABNORMAL LOW (ref 150–400)
RBC: 2.39 MIL/uL — ABNORMAL LOW (ref 3.87–5.11)
RDW: 16.6 % — ABNORMAL HIGH (ref 11.5–15.5)
WBC: 3.6 10*3/uL — ABNORMAL LOW (ref 4.0–10.5)
nRBC: 0 % (ref 0.0–0.2)

## 2020-10-30 LAB — PREPARE RBC (CROSSMATCH)

## 2020-10-30 LAB — SAMPLE TO BLOOD BANK

## 2020-10-31 ENCOUNTER — Inpatient Hospital Stay: Payer: Medicare Other

## 2020-10-31 ENCOUNTER — Other Ambulatory Visit: Payer: Self-pay

## 2020-10-31 DIAGNOSIS — D649 Anemia, unspecified: Secondary | ICD-10-CM | POA: Diagnosis not present

## 2020-10-31 MED ORDER — DIPHENHYDRAMINE HCL 50 MG/ML IJ SOLN
25.0000 mg | Freq: Once | INTRAMUSCULAR | Status: AC
  Administered 2020-10-31: 12.5 mg via INTRAVENOUS
  Filled 2020-10-31: qty 1

## 2020-10-31 MED ORDER — SODIUM CHLORIDE 0.9% FLUSH
10.0000 mL | INTRAVENOUS | Status: DC | PRN
  Filled 2020-10-31: qty 10

## 2020-10-31 MED ORDER — SODIUM CHLORIDE 0.9% IV SOLUTION
250.0000 mL | Freq: Once | INTRAVENOUS | Status: AC
Start: 2020-10-31 — End: 2020-10-31
  Administered 2020-10-31: 250 mL via INTRAVENOUS
  Filled 2020-10-31: qty 250

## 2020-10-31 MED ORDER — ACETAMINOPHEN 325 MG PO TABS
650.0000 mg | ORAL_TABLET | Freq: Once | ORAL | Status: AC
  Administered 2020-10-31: 650 mg via ORAL
  Filled 2020-10-31: qty 2

## 2020-10-31 NOTE — Patient Instructions (Signed)
  Blood Transfusion, Adult, Care After This sheet gives you information about how to care for yourself after your procedure. Your health care provider may also give you more specific instructions. If you have problems or questions, contact your health careprovider. What can I expect after the procedure? After the procedure, it is common to have: Bruising and soreness where the IV was inserted. A fever or chills on the day of the procedure. This may be your body's response to the new blood cells received. A headache. Follow these instructions at home: IV insertion site care     Follow instructions from your health care provider about how to take care of your IV insertion site. Make sure you: Wash your hands with soap and water before and after you change your bandage (dressing). If soap and water are not available, use hand sanitizer. Change your dressing as told by your health care provider. Check your IV insertion site every day for signs of infection. Check for: Redness, swelling, or pain. Bleeding from the site. Warmth. Pus or a bad smell. General instructions Take over-the-counter and prescription medicines only as told by your health care provider. Rest as told by your health care provider. Return to your normal activities as told by your health care provider. Keep all follow-up visits as told by your health care provider. This is important. Contact a health care provider if: You have itching or red, swollen areas of skin (hives). You feel anxious. You feel weak after doing your normal activities. You have redness, swelling, warmth, or pain around the IV insertion site. You have blood coming from the IV insertion site that does not stop with pressure. You have pus or a bad smell coming from your IV insertion site. Get help right away if: You have symptoms of a serious allergic or immune system reaction, including: Trouble breathing or shortness of breath. Swelling of the face  or feeling flushed. Fever or chills. Pain in the head, back, or chest. Dark urine or blood in the urine. Widespread rash. Fast heartbeat. Feeling dizzy or light-headed. If you receive your blood transfusion in an outpatient setting, you will betold whom to contact to report any reactions. These symptoms may represent a serious problem that is an emergency. Do not wait to see if the symptoms will go away. Get medical help right away. Call your local emergency services (911 in the [place] Do not drive yourself to the hospital. Summary Bruising and tenderness around the IV insertion site are common. Check your IV insertion site every day for signs of infection. Rest as told by your health care provider. Return to your normal activities as told by your health care provider. Get help right away for symptoms of a serious allergic or immune system reaction to blood transfusion. This information is not intended to replace advice given to you by your health care provider. Make sure you discuss any questions you have with your healthcare provider. Document Revised: 09/14/2018 Document Reviewed: 09/14/2018 Elsevier Patient Education  [place].

## 2020-10-31 NOTE — Progress Notes (Signed)
Patient experiences restless legs from Benadryl '25mg'$  IV. OK per Dr. Grayland Ormond for Benadryl 12.'5mg'$  IV instead. Given 12.'5mg'$  IV per orders prior to transfusion.

## 2020-11-01 LAB — TYPE AND SCREEN
ABO/RH(D): A POS
Antibody Screen: NEGATIVE
Unit division: 0

## 2020-11-01 LAB — BPAM RBC
Blood Product Expiration Date: 202208022359
ISSUE DATE / TIME: 202207291229
Unit Type and Rh: 600

## 2020-11-20 ENCOUNTER — Telehealth: Payer: Self-pay | Admitting: Family Medicine

## 2020-11-20 NOTE — Telephone Encounter (Signed)
Left message for patient to call back and schedule Medicare Annual Wellness Visit (AWV) to be done virtually or by telephone.  No hx of AWV eligible as of 11/03/20  Please schedule at anytime with [facility].      40 Minutes appointment   Any questions, please call me at [phone]

## 2020-11-24 ENCOUNTER — Telehealth: Payer: Self-pay | Admitting: *Deleted

## 2020-11-24 NOTE — Telephone Encounter (Signed)
Patient's daughter called to report care that was received at [facility]. Notes and labs are in Care Every where.

## 2020-12-01 ENCOUNTER — Other Ambulatory Visit: Payer: Self-pay

## 2020-12-01 ENCOUNTER — Other Ambulatory Visit: Payer: Self-pay | Admitting: Oncology

## 2020-12-01 ENCOUNTER — Inpatient Hospital Stay: Payer: Medicare Other | Attending: Oncology

## 2020-12-01 DIAGNOSIS — D649 Anemia, unspecified: Secondary | ICD-10-CM | POA: Diagnosis present

## 2020-12-01 LAB — CBC WITH DIFFERENTIAL/PLATELET
Abs Immature Granulocytes: 0.01 10*3/uL (ref 0.00–0.07)
Basophils Absolute: 0 10*3/uL (ref 0.0–0.1)
Basophils Relative: 1 %
Eosinophils Absolute: 0.1 10*3/uL (ref 0.0–0.5)
Eosinophils Relative: 4 %
HCT: 20.8 % — ABNORMAL LOW (ref 36.0–46.0)
Hemoglobin: 6.2 g/dL — ABNORMAL LOW (ref 12.0–15.0)
Immature Granulocytes: 0 %
Lymphocytes Relative: 6 %
Lymphs Abs: 0.2 10*3/uL — ABNORMAL LOW (ref 0.7–4.0)
MCH: 25.7 pg — ABNORMAL LOW (ref 26.0–34.0)
MCHC: 29.8 g/dL — ABNORMAL LOW (ref 30.0–36.0)
MCV: 86.3 fL (ref 80.0–100.0)
Monocytes Absolute: 0.5 10*3/uL (ref 0.1–1.0)
Monocytes Relative: 15 %
Neutro Abs: 2.8 10*3/uL (ref 1.7–7.7)
Neutrophils Relative %: 74 %
Platelets: 146 10*3/uL — ABNORMAL LOW (ref 150–400)
RBC: 2.41 MIL/uL — ABNORMAL LOW (ref 3.87–5.11)
RDW: 15.3 % (ref 11.5–15.5)
WBC: 3.7 10*3/uL — ABNORMAL LOW (ref 4.0–10.5)
nRBC: 0 % (ref 0.0–0.2)

## 2020-12-01 LAB — SAMPLE TO BLOOD BANK

## 2020-12-01 LAB — PREPARE RBC (CROSSMATCH)

## 2020-12-02 ENCOUNTER — Inpatient Hospital Stay: Payer: Medicare Other

## 2020-12-02 DIAGNOSIS — D649 Anemia, unspecified: Secondary | ICD-10-CM | POA: Diagnosis not present

## 2020-12-02 MED ORDER — SODIUM CHLORIDE 0.9% IV SOLUTION
250.0000 mL | Freq: Once | INTRAVENOUS | Status: AC
  Administered 2020-12-02: 250 mL via INTRAVENOUS
  Filled 2020-12-02: qty 250

## 2020-12-02 MED ORDER — DIPHENHYDRAMINE HCL 50 MG/ML IJ SOLN
25.0000 mg | Freq: Once | INTRAMUSCULAR | Status: AC
Start: 2020-12-02 — End: 2020-12-02
  Administered 2020-12-02: 25 mg via INTRAVENOUS
  Filled 2020-12-02: qty 1

## 2020-12-02 MED ORDER — ACETAMINOPHEN 325 MG PO TABS
650.0000 mg | ORAL_TABLET | Freq: Once | ORAL | Status: AC
  Administered 2020-12-02: 650 mg via ORAL
  Filled 2020-12-02: qty 2

## 2020-12-02 NOTE — Patient Instructions (Signed)
Blood Transfusion, Adult, Care After This sheet gives you information about how to care for yourself after your procedure. Your doctor may also give you more specific instructions. If youhave problems or questions, contact your doctor. What can I expect after the procedure? After the procedure, it is common to have: Bruising and soreness at the IV site. A fever or chills on the day of the procedure. This may be your body's response to the new blood cells received. A headache. Follow these instructions at home: Insertion site care     Follow instructions from your doctor about how to take care of your insertion site. This is where an IV tube was put into your vein. Make sure you: Wash your hands with soap and water before and after you change your bandage (dressing). If you cannot use soap and water, use hand sanitizer. Change your bandage as told by your doctor. Check your insertion site every day for signs of infection. Check for: Redness, swelling, or pain. Bleeding from the site. Warmth. Pus or a bad smell. General instructions Take over-the-counter and prescription medicines only as told by your doctor. Rest as told by your doctor. Go back to your normal activities as told by your doctor. Keep all follow-up visits as told by your doctor. This is important. Contact a doctor if: You have itching or red, swollen areas of skin (hives). You feel worried or nervous (anxious). You feel weak after doing your normal activities. You have redness, swelling, warmth, or pain around the insertion site. You have blood coming from the insertion site, and the blood does not stop with pressure. You have pus or a bad smell coming from the insertion site. Get help right away if: You have signs of a serious reaction. This may be coming from an allergy or the body's defense system (immune system). Signs include: Trouble breathing or shortness of breath. Swelling of the face or feeling warm  (flushed). Fever or chills. Head, chest, or back pain. Dark pee (urine) or blood in the pee. Widespread rash. Fast heartbeat. Feeling dizzy or light-headed. You may receive your blood transfusion in an outpatient setting. If so, youwill be told whom to contact to report any reactions. These symptoms may be an emergency. Do not wait to see if the symptoms will go away. Get medical help right away. Call your local emergency services (911 in the [place] Do not drive yourself to the hospital. Summary Bruising and soreness at the IV site are common. Check your insertion site every day for signs of infection. Rest as told by your doctor. Go back to your normal activities as told by your doctor. Get help right away if you have signs of a serious reaction. This information is not intended to replace advice given to you by your health care provider. Make sure you discuss any questions you have with your healthcare provider. Document Revised: 09/14/2018 Document Reviewed: 09/14/2018 Elsevier Patient Education  2022 [employer]  

## 2020-12-03 LAB — TYPE AND SCREEN
ABO/RH(D): A POS
Antibody Screen: NEGATIVE
Unit division: 0
Unit division: 0

## 2020-12-03 LAB — BPAM RBC
Blood Product Expiration Date: 202209302359
Blood Product Expiration Date: 202210012359
ISSUE DATE / TIME: 202208301014
ISSUE DATE / TIME: 202208301223
Unit Type and Rh: 6200
Unit Type and Rh: 6200

## 2020-12-27 NOTE — Progress Notes (Deleted)
[place]  [phone]) [phone] Fax:(336) [phone]  ID: April Blake OB: 11/17/1940  MR#: [id number]  URK#:[id number]  Patient Care Team: Olin Hauser, DO as PCP - General (Family Medicine)  CHIEF COMPLAINT: Anemia, unspecified.  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  All of her care has been received at [facility].  Because she requires intermittent blood transfusions, patient wishes to establish care closer to home for treatment.  She currently feels well and is at her baseline.  She has no neurologic complaints.  She denies any recent fevers or illnesses.  She has a fair appetite, but denies weight loss.  She has no chest pain, shortness of breath, cough, or hemoptysis.  She denies any nausea, vomiting, constipation, or diarrhea.  She has noted no recent melena or hematochezia.  She has no urinary complaints.  Patient offers no further specific complaints today.  REVIEW OF SYSTEMS:   Review of Systems  Constitutional:  Positive for malaise/fatigue. Negative for fever and weight loss.  Respiratory: Negative.  Negative for cough, hemoptysis and shortness of breath.   Cardiovascular: Negative.  Negative for chest pain and leg swelling.  Gastrointestinal: Negative.  Negative for abdominal pain, blood in stool and melena.  Genitourinary: Negative.  Negative for dysuria and hematuria.  Musculoskeletal: Negative.  Negative for back pain.  Skin: Negative.  Negative for rash.  Neurological:  Positive for weakness. Negative for dizziness, focal weakness and headaches.  Psychiatric/Behavioral: Negative.  The patient is not nervous/anxious.    As per HPI. Otherwise, a complete review of systems is negative.  PAST MEDICAL HISTORY: Past Medical History:  Diagnosis Date   Anemia    Heart disease    Hyperlipidemia    Hypertension    Liver disease     Thyroid disease    Uterine cancer ([place])     PAST SURGICAL HISTORY: Reviewed and unchanged.  FAMILY HISTORY: No family history on file.  ADVANCED DIRECTIVES (Y/N):  N  HEALTH MAINTENANCE: Social History   Tobacco Use   Smoking status: Former    Packs/day: 0.20    Years: 5.00    Pack years: 1.00    Types: Cigarettes   Smokeless tobacco: Never  Substance Use Topics   Alcohol use: Never   Drug use: Never     Colonoscopy:  PAP:  Bone density:  Lipid panel:  No Known Allergies  Current Outpatient Medications  Medication Sig Dispense Refill   ACCU-CHEK GUIDE test strip 1 each 4 (four) times daily.     acetaminophen (TYLENOL) 500 MG tablet Take by mouth. (Patient not taking: Reported on 10/14/2020)     albuterol (VENTOLIN HFA) 108 (90 Base) MCG/ACT inhaler 2 puffs as needed (Patient not taking: Reported on 10/14/2020)     bisacodyl (DULCOLAX) 5 MG EC tablet Take by mouth.     Budeson-Glycopyrrol-Formoterol (BREZTRI AEROSPHERE) 160-9-4.8 MCG/ACT AERO 2 puffs     Coenzyme Q10 (CO Q-10) 200 MG CAPS 1 capsule with a meal     Cysteamine Bitartrate (PROCYSBI) 300 MG PACK Use 1 each 3 (three) times daily Use as instructed.     diclofenac Sodium (VOLTAREN) 1 % GEL Apply 2 g topically 4 (four) times daily as needed (osteoarthritis knee pain). 100 g 2   ferrous sulfate 325 (65 FE) MG tablet 1 tablet     furosemide (LASIX) 20 MG tablet Take 60 mg by mouth daily.  glucose blood (ACCU-CHEK AVIVA PLUS) test strip      glucose blood (ACCU-CHEK GUIDE) test strip Check BS 4/day -Fluctuating blood sugars     insulin glargine (LANTUS SOLOSTAR) 100 UNIT/ML Solostar Pen 20 units in the morning     insulin regular (NOVOLIN R) 100 units/mL injection See admin instructions.     levothyroxine (SYNTHROID) 75 MCG tablet 1 tablet in the morning on an empty stomach     omeprazole (PRILOSEC) 40 MG capsule Take 40 mg by mouth 2 (two) times daily before a meal.     ondansetron (ZOFRAN) 4 MG tablet 1  tablet as needed for nausea/vomiting (Patient not taking: Reported on 10/14/2020)     polyethylene glycol powder (GLYCOLAX/MIRALAX) 17 GM/SCOOP powder Take by mouth.     propranolol (INDERAL) 10 MG tablet 1 tablet on an empty stomach     simvastatin (ZOCOR) 20 MG tablet Take 1 tablet (20 mg total) by mouth at bedtime. 90 tablet 3   spironolactone (ALDACTONE) 50 MG tablet Take 100 mg by mouth daily.     traMADol (ULTRAM) 50 MG tablet Take 1 tablet (50 mg total) by mouth at bedtime as needed for moderate pain. 30 tablet 1   No current facility-administered medications for this visit.    OBJECTIVE: There were no vitals filed for this visit.    There is no height or weight on file to calculate BMI.    ECOG FS:1 - Symptomatic but completely ambulatory  General: Well-developed, well-nourished, no acute distress. Eyes: Pink conjunctiva, anicteric sclera. HEENT: Normocephalic, moist mucous membranes. Lungs: No audible wheezing or coughing. Heart: Regular rate and rhythm. Abdomen: Soft, nontender, no obvious distention. Musculoskeletal: No edema, cyanosis, or clubbing. Neuro: Alert, answering all questions appropriately. Cranial nerves grossly intact. Skin: No rashes or petechiae noted. Psych: Normal affect. Lymphatics: No cervical, calvicular, axillary or inguinal LAD.   LAB RESULTS:  No results found for: NA, K, CL, CO2, GLUCOSE, BUN, CREATININE, CALCIUM, PROT, ALBUMIN, AST, ALT, ALKPHOS, BILITOT, GFRNONAA, GFRAA  Lab Results  Component Value Date   WBC 3.7 (L) 12/01/2020   NEUTROABS 2.8 12/01/2020   HGB 6.2 (L) 12/01/2020   HCT 20.8 (L) 12/01/2020   MCV 86.3 12/01/2020   PLT 146 (L) 12/01/2020   Lab Results  Component Value Date   IRON 34 09/30/2020   TIBC 437 09/30/2020   IRONPCTSAT 8 (L) 09/30/2020   Lab Results  Component Value Date   FERRITIN 19 09/30/2020     STUDIES: No results found.  ASSESSMENT: Anemia, unspecified.   PLAN:    Anemia, unspecified: Secondary  to NASH and GAVE.  We will continue to monitor patient's hemoglobin periodically and transfuse if falls below 7.0.  If patient's hemoglobin is between 7.0-8.0 we will base transfusion on patient's performance status.  If greater than 8.0, no transfusion will be necessary.  Her most recent hemoglobin is 6.0, therefore she will return to clinic tomorrow for 2 units of packed red blood cells.  Return to clinic monthly for laboratory work and consideration of transfusion.  Patient then return to clinic in 3 months for laboratory work, further evaluation, and continuation of treatment if needed. NASH/GAVE syndrome: Continue follow-up and treatment at [facility].  I spent a total of 60 minutes reviewing chart data, face-to-face evaluation with the patient, counseling and coordination of care as detailed above.   Patient expressed understanding and was in agreement with this plan. She also understands that She can call clinic at any time with any  questions, concerns, or complaints.    Lloyd Huger, MD   12/27/2020 7:47 PM

## 2020-12-31 ENCOUNTER — Other Ambulatory Visit: Payer: Self-pay | Admitting: Emergency Medicine

## 2020-12-31 DIAGNOSIS — D649 Anemia, unspecified: Secondary | ICD-10-CM

## 2021-01-01 ENCOUNTER — Inpatient Hospital Stay: Payer: Medicare Other | Admitting: Oncology

## 2021-01-01 ENCOUNTER — Inpatient Hospital Stay: Payer: Medicare Other

## 2021-01-01 DIAGNOSIS — D649 Anemia, unspecified: Secondary | ICD-10-CM

## 2021-01-02 ENCOUNTER — Inpatient Hospital Stay: Payer: Medicare Other

## 2021-01-12 ENCOUNTER — Other Ambulatory Visit: Payer: Self-pay | Admitting: Oncology

## 2021-01-12 ENCOUNTER — Inpatient Hospital Stay: Payer: Medicare Other | Attending: Oncology

## 2021-01-12 ENCOUNTER — Inpatient Hospital Stay (HOSPITAL_BASED_OUTPATIENT_CLINIC_OR_DEPARTMENT_OTHER): Payer: Medicare Other | Admitting: Oncology

## 2021-01-12 ENCOUNTER — Other Ambulatory Visit: Payer: Self-pay

## 2021-01-12 VITALS — BP 113/53 | HR 68 | Temp 97.9°F | Resp 16 | Wt 173.0 lb

## 2021-01-12 DIAGNOSIS — Z87891 Personal history of nicotine dependence: Secondary | ICD-10-CM | POA: Insufficient documentation

## 2021-01-12 DIAGNOSIS — E119 Type 2 diabetes mellitus without complications: Secondary | ICD-10-CM | POA: Diagnosis not present

## 2021-01-12 DIAGNOSIS — K7581 Nonalcoholic steatohepatitis (NASH): Secondary | ICD-10-CM | POA: Insufficient documentation

## 2021-01-12 DIAGNOSIS — D649 Anemia, unspecified: Secondary | ICD-10-CM

## 2021-01-12 DIAGNOSIS — Z8542 Personal history of malignant neoplasm of other parts of uterus: Secondary | ICD-10-CM | POA: Diagnosis not present

## 2021-01-12 DIAGNOSIS — K649 Unspecified hemorrhoids: Secondary | ICD-10-CM | POA: Diagnosis not present

## 2021-01-12 DIAGNOSIS — K31819 Angiodysplasia of stomach and duodenum without bleeding: Secondary | ICD-10-CM | POA: Insufficient documentation

## 2021-01-12 DIAGNOSIS — D500 Iron deficiency anemia secondary to blood loss (chronic): Secondary | ICD-10-CM | POA: Diagnosis not present

## 2021-01-12 LAB — PREPARE RBC (CROSSMATCH)

## 2021-01-12 LAB — CBC WITH DIFFERENTIAL/PLATELET
Abs Immature Granulocytes: 0.02 10*3/uL (ref 0.00–0.07)
Basophils Absolute: 0 10*3/uL (ref 0.0–0.1)
Basophils Relative: 0 %
Eosinophils Absolute: 0.1 10*3/uL (ref 0.0–0.5)
Eosinophils Relative: 4 %
HCT: 20.5 % — ABNORMAL LOW (ref 36.0–46.0)
Hemoglobin: 5.9 g/dL — ABNORMAL LOW (ref 12.0–15.0)
Immature Granulocytes: 1 %
Lymphocytes Relative: 7 %
Lymphs Abs: 0.3 10*3/uL — ABNORMAL LOW (ref 0.7–4.0)
MCH: 25.4 pg — ABNORMAL LOW (ref 26.0–34.0)
MCHC: 28.8 g/dL — ABNORMAL LOW (ref 30.0–36.0)
MCV: 88.4 fL (ref 80.0–100.0)
Monocytes Absolute: 0.4 10*3/uL (ref 0.1–1.0)
Monocytes Relative: 11 %
Neutro Abs: 2.9 10*3/uL (ref 1.7–7.7)
Neutrophils Relative %: 77 %
Platelets: 173 10*3/uL (ref 150–400)
RBC: 2.32 MIL/uL — ABNORMAL LOW (ref 3.87–5.11)
RDW: 17.5 % — ABNORMAL HIGH (ref 11.5–15.5)
WBC: 3.8 10*3/uL — ABNORMAL LOW (ref 4.0–10.5)
nRBC: 0 % (ref 0.0–0.2)

## 2021-01-12 LAB — SAMPLE TO BLOOD BANK

## 2021-01-12 NOTE — Progress Notes (Signed)
Pt reports difficulty falling asleep and staying asleep. Also notice blood to toilet paper when using the bathroom this morning. No other complaints at this time.

## 2021-01-12 NOTE — Progress Notes (Signed)
[place]  Telephone:(336) [phone] Fax:(336) [phone]  ID: April Blake OB: October 04, 1940  MR#: [id number]  OYD#:[id number]  Patient Care Team: Olin Hauser, DO as PCP - General (Family Medicine)  CHIEF COMPLAINT: Anemia, unspecified.  INTERVAL HISTORY: April Blake is a 80 year old female with past medical history significant for gave syndrome, portal vein thrombus, hypertension, liver cirrhosis secondary to [facility], esophageal varices, type 2 diabetes, hypothyroidism, hyperlipidemia, iron deficiency anemia who is followed Dr. Grayland Ormond.  Previously she has received care at [facility] but given frequent IV infusions required she wishes to establish care locally.  She had an upper endoscopy on 12/10/2020 at [facility] unavailable.  Daughter states they were able to treat more area this time.  She received 2 units of PRBC on 12/02/2020.  Reports 2 days worth of bright red blood first thing in the morning.  States she was constipated and did have several bowel movements that required straining.  Reports some clotting as well.  Daughter states normally she has dark tarry stools so this is different.  Denies any rectal or abdominal pain.  Patient reports history of hemorrhoids.  She is using hemorrhoid cleaning wipes that seem to help.  Notes feeling dizzy when standing.   REVIEW OF SYSTEMS:   Review of Systems  Constitutional:  Positive for malaise/fatigue. Negative for chills, fever and weight loss.  HENT:  Negative for congestion, ear pain and tinnitus.   Eyes: Negative.  Negative for blurred vision and double vision.  Respiratory: Negative.  Negative for cough, sputum production and shortness of breath.   Cardiovascular: Negative.  Negative for chest pain, palpitations and leg swelling.  Gastrointestinal:  Positive for blood in stool. Negative for abdominal pain, constipation, diarrhea, nausea and vomiting.  Genitourinary:  Negative for dysuria, frequency and  urgency.  Musculoskeletal:  Negative for back pain and falls.  Skin: Negative.  Negative for rash.  Neurological:  Positive for weakness. Negative for headaches.  Endo/Heme/Allergies: Negative.  Does not bruise/bleed easily.  Psychiatric/Behavioral: Negative.  Negative for depression. The patient is not nervous/anxious and does not have insomnia.    As per HPI. Otherwise, a complete review of systems is negative.  PAST MEDICAL HISTORY: Past Medical History:  Diagnosis Date   Anemia    Heart disease    Hyperlipidemia    Hypertension    Liver disease    Thyroid disease    Uterine cancer ([place])     PAST SURGICAL HISTORY: Reviewed and unchanged.  FAMILY HISTORY: No family history on file.  ADVANCED DIRECTIVES (Y/N):  N  HEALTH MAINTENANCE: Social History   Tobacco Use   Smoking status: Former    Packs/day: 0.20    Years: 5.00    Pack years: 1.00    Types: Cigarettes   Smokeless tobacco: Never  Substance Use Topics   Alcohol use: Never   Drug use: Never     Colonoscopy:  PAP:  Bone density:  Lipid panel:  No Known Allergies  Current Outpatient Medications  Medication Sig Dispense Refill   ACCU-CHEK GUIDE test strip 1 each 4 (four) times daily.     acetaminophen (TYLENOL) 500 MG tablet Take by mouth. (Patient not taking: Reported on 10/14/2020)     albuterol (VENTOLIN HFA) 108 (90 Base) MCG/ACT inhaler 2 puffs as needed (Patient not taking: Reported on 10/14/2020)     bisacodyl (DULCOLAX) 5 MG EC tablet Take by mouth.     Budeson-Glycopyrrol-Formoterol (BREZTRI AEROSPHERE) 160-9-4.8 MCG/ACT AERO 2 puffs     Coenzyme  Q10 (CO Q-10) 200 MG CAPS 1 capsule with a meal     Cysteamine Bitartrate (PROCYSBI) 300 MG PACK Use 1 each 3 (three) times daily Use as instructed.     diclofenac Sodium (VOLTAREN) 1 % GEL Apply 2 g topically 4 (four) times daily as needed (osteoarthritis knee pain). 100 g 2   ferrous sulfate 325 (65 FE) MG tablet 1 tablet     furosemide (LASIX) 20 MG  tablet Take 60 mg by mouth daily.     glucose blood (ACCU-CHEK AVIVA PLUS) test strip      glucose blood (ACCU-CHEK GUIDE) test strip Check BS 4/day -Fluctuating blood sugars     insulin glargine (LANTUS SOLOSTAR) 100 UNIT/ML Solostar Pen 20 units in the morning     insulin regular (NOVOLIN R) 100 units/mL injection See admin instructions.     levothyroxine (SYNTHROID) 75 MCG tablet 1 tablet in the morning on an empty stomach     omeprazole (PRILOSEC) 40 MG capsule Take 40 mg by mouth 2 (two) times daily before a meal.     ondansetron (ZOFRAN) 4 MG tablet 1 tablet as needed for nausea/vomiting (Patient not taking: Reported on 10/14/2020)     polyethylene glycol powder (GLYCOLAX/MIRALAX) 17 GM/SCOOP powder Take by mouth.     propranolol (INDERAL) 10 MG tablet 1 tablet on an empty stomach     simvastatin (ZOCOR) 20 MG tablet Take 1 tablet (20 mg total) by mouth at bedtime. 90 tablet 3   spironolactone (ALDACTONE) 50 MG tablet Take 100 mg by mouth daily.     traMADol (ULTRAM) 50 MG tablet Take 1 tablet (50 mg total) by mouth at bedtime as needed for moderate pain. 30 tablet 1   No current facility-administered medications for this visit.    OBJECTIVE: There were no vitals filed for this visit.    There is no height or weight on file to calculate BMI.    ECOG FS:1 - Symptomatic but completely ambulatory  Physical Exam Constitutional:      Appearance: Normal appearance.  HENT:     Head: Normocephalic and atraumatic.  Eyes:     Pupils: Pupils are equal, round, and reactive to light.  Cardiovascular:     Rate and Rhythm: Normal rate and regular rhythm.     Heart sounds: Normal heart sounds. No murmur heard. Pulmonary:     Effort: Pulmonary effort is normal.     Breath sounds: Normal breath sounds. No wheezing.  Abdominal:     General: Bowel sounds are normal. There is no distension.     Palpations: Abdomen is soft.     Tenderness: There is no abdominal tenderness.  Musculoskeletal:         General: Normal range of motion.     Cervical back: Normal range of motion.  Skin:    General: Skin is warm and dry.     Findings: No rash.  Neurological:     Mental Status: She is alert and oriented to person, place, and time.  Psychiatric:        Judgment: Judgment normal.     LAB RESULTS:  No results found for: NA, K, CL, CO2, GLUCOSE, BUN, CREATININE, CALCIUM, PROT, ALBUMIN, AST, ALT, ALKPHOS, BILITOT, GFRNONAA, GFRAA  Lab Results  Component Value Date   WBC 3.7 (L) 12/01/2020   NEUTROABS 2.8 12/01/2020   HGB 6.2 (L) 12/01/2020   HCT 20.8 (L) 12/01/2020   MCV 86.3 12/01/2020   PLT 146 (L) 12/01/2020   Lab  Results  Component Value Date   IRON 34 09/30/2020   TIBC 437 09/30/2020   IRONPCTSAT 8 (L) 09/30/2020   Lab Results  Component Value Date   FERRITIN 19 09/30/2020     STUDIES: No results found.  ASSESSMENT: Anemia, unspecified.   PLAN:    Anemia, unspecified-secondary to NASH and gave.  She last received a unit of PRBC on 12/02/2020.  Goal is to maintain a hemoglobin greater than 7.  Labs from today show a hemoglobin of 5.9.  Proceed with 2 units PRBC tomorrow.  Reports new onset bright red blood per rectum.  Denies any pain.  Has history of hemorrhoids.  Recommend over-the-counter hemorrhoid cream.  If symptoms do not improve, patient needs to be seen by GI.  Orders placed for blood. Hemorrhoidal bleeding-OTC medications.  Denies any pain.  Disposition-return to clinic monthly for labs and possible blood and see Dr. Grayland Ormond in 3 months with labs and possible blood.  I spent 20 minutes dedicated to the care of this patient (face-to-face and non-face-to-face) on the date of the encounter to include what is described in the assessment and plan.  Patient expressed understanding and was in agreement with this plan. She also understands that She can call clinic at any time with any questions, concerns, or complaints.    Jacquelin Hawking, NP   01/12/2021  1:17 PM

## 2021-01-13 ENCOUNTER — Inpatient Hospital Stay: Payer: Medicare Other

## 2021-01-13 DIAGNOSIS — D500 Iron deficiency anemia secondary to blood loss (chronic): Secondary | ICD-10-CM | POA: Diagnosis not present

## 2021-01-13 DIAGNOSIS — D649 Anemia, unspecified: Secondary | ICD-10-CM

## 2021-01-13 MED ORDER — ACETAMINOPHEN 325 MG PO TABS
650.0000 mg | ORAL_TABLET | Freq: Once | ORAL | Status: AC
  Administered 2021-01-13: 650 mg via ORAL
  Filled 2021-01-13: qty 2

## 2021-01-13 MED ORDER — DIPHENHYDRAMINE HCL 25 MG PO CAPS
25.0000 mg | ORAL_CAPSULE | Freq: Once | ORAL | Status: AC
  Administered 2021-01-13: 25 mg via ORAL
  Filled 2021-01-13: qty 1

## 2021-01-13 MED ORDER — SODIUM CHLORIDE 0.9% IV SOLUTION
250.0000 mL | Freq: Once | INTRAVENOUS | Status: AC
  Administered 2021-01-13: 250 mL via INTRAVENOUS
  Filled 2021-01-13: qty 250

## 2021-01-13 NOTE — Patient Instructions (Signed)
[facility] [place] [facility]  Discharge Instructions: Thank you for choosing [facility] to provide your oncology and hematology care.  If you have a lab appointment with the Cancer Center, please go directly to the Cancer Center and check in at the registration area.  Wear comfortable clothing and clothing appropriate for easy access to any Portacath or PICC line.   We strive to give you quality time with your provider. You may need to reschedule your appointment if you arrive late (15 or more minutes).  Arriving late affects you and other patients whose appointments are after yours.  Also, if you miss three or more appointments without notifying the office, you may be dismissed from the clinic at the provider's discretion.      For prescription refill requests, have your pharmacy contact our office and allow 72 hours for refills to be completed.    Today you received the following : Blood transfusion   Blood Transfusion, Adult, Care After This sheet gives you information about how to care for yourself after your procedure. Your doctor may also give you more specific instructions. If you have problems or questions, contact your doctor. What can I expect after the procedure? After the procedure, it is common to have: Bruising and soreness at the IV site. A headache. Follow these instructions at home: Insertion site care   Follow instructions from your doctor about how to take care of your insertion site. This is where an IV tube was put into your vein. Make sure you: Wash your hands with soap and water before and after you change your bandage (dressing). If you cannot use soap and water, use hand sanitizer. Change your bandage as told by your doctor. Check your insertion site every day for signs of infection. Check for: Redness, swelling, or pain. Bleeding from the site. Warmth. Pus or a bad smell. General instructions Take over-the-counter and  prescription medicines only as told by your doctor. Rest as told by your doctor. Go back to your normal activities as told by your doctor. Keep all follow-up visits as told by your doctor. This is important. Contact a doctor if: You have itching or red, swollen areas of skin (hives). You feel worried or nervous (anxious). You feel weak after doing your normal activities. You have redness, swelling, warmth, or pain around the insertion site. You have blood coming from the insertion site, and the blood does not stop with pressure. You have pus or a bad smell coming from the insertion site. Get help right away if: You have signs of a serious reaction. This may be coming from an allergy or the body's defense system (immune system). Signs include: Trouble breathing or shortness of breath. Swelling of the face or feeling warm (flushed). Fever or chills. Head, chest, or back pain. Dark pee (urine) or blood in the pee. Widespread rash. Fast heartbeat. Feeling dizzy or light-headed. You may receive your blood transfusion in an outpatient setting. If so, you will be told whom to contact to report any reactions. These symptoms may be an emergency. Do not wait to see if the symptoms will go away. Get medical help right away. Call your local emergency services (911 in the [place] Do not drive yourself to the hospital. Summary Bruising and soreness at the IV site are common. Check your insertion site every day for signs of infection. Rest as told by your doctor. Go back to your normal activities as told by your doctor. Get help   right away if you have signs of a serious reaction. This information is not intended to replace advice given to you by your health care provider. Make sure you discuss any questions you have with your health care provider. Document Revised: 07/17/2020 Document Reviewed: 09/14/2018 Elsevier Patient Education  2022 Elsevier Inc.    To help prevent nausea and vomiting after  your treatment, we encourage you to take your nausea medication as directed.  BELOW ARE SYMPTOMS THAT SHOULD BE REPORTED IMMEDIATELY: *FEVER GREATER THAN 100.4 F (38 C) OR HIGHER *CHILLS OR SWEATING *NAUSEA AND VOMITING THAT IS NOT CONTROLLED WITH YOUR NAUSEA MEDICATION *UNUSUAL SHORTNESS OF BREATH *UNUSUAL BRUISING OR BLEEDING *URINARY PROBLEMS (pain or burning when urinating, or frequent urination) *BOWEL PROBLEMS (unusual diarrhea, constipation, pain near the anus) TENDERNESS IN MOUTH AND THROAT WITH OR WITHOUT PRESENCE OF ULCERS (sore throat, sores in mouth, or a toothache) UNUSUAL RASH, SWELLING OR PAIN  UNUSUAL VAGINAL DISCHARGE OR ITCHING   Items with * indicate a potential emergency and should be followed up as soon as possible or go to the Emergency Department if any problems should occur.  Please show the CHEMOTHERAPY ALERT CARD or IMMUNOTHERAPY ALERT CARD at check-in to the Emergency Department and triage nurse.  Should you have questions after your visit or need to cancel or reschedule your appointment, please contact [facility] [place] [facility]  [phone] and follow the prompts.  Office hours are 8:00 a.m. to 4:30 p.m. Monday - Friday. Please note that voicemails left after 4:00 p.m. may not be returned until the following business day.  We are closed weekends and major holidays. You have access to a nurse at all times for urgent questions. Please call the main number to the clinic [phone] and follow the prompts.  For any non-urgent questions, you may also contact your provider using MyChart. We now offer e-Visits for anyone 18 and older to request care online for non-urgent symptoms. For details visit [email or web address].   Also download the MyChart app! Go to the app store, search "MyChart", open the app, select [place], and log in with your MyChart username and password.  Due to Covid, a mask is required upon entering the hospital/clinic.  If you do not have a mask, one will be given to you upon arrival. For doctor visits, patients may have 1 support person aged 18 or older with them. For treatment visits, patients cannot have anyone with them due to current Covid guidelines and our immunocompromised population.  

## 2021-01-14 ENCOUNTER — Other Ambulatory Visit: Payer: Self-pay | Admitting: Family Medicine

## 2021-01-14 DIAGNOSIS — K31819 Angiodysplasia of stomach and duodenum without bleeding: Secondary | ICD-10-CM

## 2021-01-14 NOTE — Telephone Encounter (Signed)
Requested medication (s) are due for refill today: Amount not specified  Requested medication (s) are on the active medication list: yes  Last refill: 09/30/20  Future visit scheduled no  Notes to clinic   Historical Provider  Requested Prescriptions  Pending Prescriptions Disp Refills   omeprazole (PRILOSEC) 40 MG capsule      Sig: Take 1 capsule (40 mg total) by mouth 2 (two) times daily before a meal.     Gastroenterology: Proton Pump Inhibitors Passed - 01/14/2021 10:32 AM      Passed - Valid encounter within last 12 months    Recent Outpatient Visits           3 months ago Essential hypertension   [place], Devonne Doughty, DO

## 2021-01-14 NOTE — Telephone Encounter (Signed)
Copied from [place] [id number]. Topic: Quick Communication - Rx Refill/Question >> Jan 14, 2021  8:49 AM Tessa Lerner A wrote: Medication: omeprazole (PRILOSEC) 40 MG capsule [[id number]]   Has the patient contacted their pharmacy? No. (Agent: If no, request that the patient contact the pharmacy for the refill.) (Agent: If yes, when and what did the pharmacy advise?)  Preferred Pharmacy (with phone number or street name): [place], [place]  Phone:  [phone] Fax:  [phone]  Has the patient been seen for an appointment in the last year OR does the patient have an upcoming appointment? Yes.    Agent: Please be advised that RX refills may take up to 3 business days. We ask that you follow-up with your pharmacy.

## 2021-01-15 ENCOUNTER — Other Ambulatory Visit: Payer: Medicare Other

## 2021-01-15 ENCOUNTER — Ambulatory Visit: Payer: Medicare Other | Admitting: Oncology

## 2021-01-15 ENCOUNTER — Ambulatory Visit: Payer: Medicare Other

## 2021-01-15 LAB — TYPE AND SCREEN
ABO/RH(D): A POS
Antibody Screen: NEGATIVE
Unit division: 0
Unit division: 0

## 2021-01-15 LAB — BPAM RBC
Blood Product Expiration Date: 202210152359
Blood Product Expiration Date: 202210162359
ISSUE DATE / TIME: 202210111019
ISSUE DATE / TIME: 202210111240
Unit Type and Rh: 6200
Unit Type and Rh: 6200

## 2021-01-15 MED ORDER — OMEPRAZOLE 40 MG PO CPDR: 40.0000 mg | DELAYED_RELEASE_CAPSULE | Freq: Two times a day (BID) | ORAL | 1 refills | Status: AC

## 2021-01-16 ENCOUNTER — Ambulatory Visit: Payer: Medicare Other

## 2021-01-19 ENCOUNTER — Ambulatory Visit: Payer: Self-pay | Admitting: *Deleted

## 2021-01-19 NOTE — Telephone Encounter (Signed)
Pt's daughter called in [place].    She was not with her mother but called her mother and other sister on the other line.   Her mother lives with her other daughter in [place], [place].    Mother in background answering questions.    She is having bright red rectal bleeding with clots and mucus consistency since Sat. Night.  Denies abd or rectal pain or dizziness.   She sees a GI dr at [facility] for [place].    The capillaries bleed easily and she has to have them cauterized.    9/7 last time that was done and has not had a problem since.   See triage notes.  I have referred her to the ED which she was agreeable to going.  Notes sent to [facility] for Dr. Parks Ranger.

## 2021-01-19 NOTE — Telephone Encounter (Signed)
Reason for Disposition  [1] MODERATE rectal bleeding (small blood clots, passing blood without stool, or toilet water turns red) AND [2] more than once a day  Answer Assessment - Initial Assessment Questions 1. APPEARANCE of BLOOD: "What color is it?" "Is it passed separately, on the surface of the stool, or mixed in with the stool?"      April Blake calling in, daughter.     There's blood when she has a BM.   I called her GI dr. At [facility] and he said to call PCP because they can't see her today. 2. AMOUNT: "How much blood was passed?"      Started Sat. Night and it occurs every time she has a BM.  Bright red and it has clots and is like mucus.   3. FREQUENCY: "How many times has blood been passed with the stools?"      Every time she has a BM.   April Blake called her mother on other line to answer questions   She's had 3 BMs with bright red blood noted.    4. ONSET: "When was the blood first seen in the stools?" (Days or weeks)      Saturday night 5. DIARRHEA: "Is there also some diarrhea?" If Yes, ask: "How many diarrhea stools in the past 24 hours?"      No diarrhea.   Soft stools normal 6. CONSTIPATION: "Do you have constipation?" If Yes, ask: "How bad is it?"     No 7. RECURRENT SYMPTOMS: "Have you had blood in your stools before?" If Yes, ask: "When was the last time?" and "What happened that time?"      She's never had bright red stools.    GAVE it gives her dark tarry stools.   Reason seeing GI dr at [facility].    My mother lives with my sister in [place], [place].   Stools are not black at this time 8. BLOOD THINNERS: "Do you take any blood thinners?" (e.g., Coumadin/warfarin, Pradaxa/dabigatran, aspirin)     No 9. OTHER SYMPTOMS: "Do you have any other symptoms?"  (e.g., abdomen pain, vomiting, dizziness, fever)     No dizziness or abd pain.   No recent illnesses.    10. PREGNANCY: "Is there any chance you are pregnant?" "When was your last menstrual period?"       N/A due to age  Protocols used:  Rectal Bleeding-A-AH

## 2021-01-20 ENCOUNTER — Telehealth: Payer: Self-pay

## 2021-01-20 NOTE — Telephone Encounter (Signed)
Patient's daughter called to report that patient is having rectal bleeding and was advised by GI to have hemoglobin checked this week.  Please contact patient's daughter, Ginger, to schedule lab only this week.

## 2021-01-21 ENCOUNTER — Inpatient Hospital Stay: Payer: Medicare Other

## 2021-01-28 ENCOUNTER — Telehealth: Payer: Self-pay

## 2021-01-28 NOTE — Telephone Encounter (Signed)
Copied from [place] [id number]. Topic: General - Inquiry >> Jan 28, 2021  3:52 PM Loma Boston wrote: Reason for CRM: [facility] , [place] is calling concerning pt being discharged from [facility] today and states all the verbal orders following up with discharge from [facility] are signed off on by drs at [facility]. He is calling wanting feed- back from Dr Raliegh Ip if needs any additional Verbal orders after discharge are needed that he is willing to follow-up as PCP. Please confirm with [place] at secured line [phone] .Appt was offered but he states will need to be at a later date. FU [place] to advise/confirm

## 2021-01-29 NOTE — Telephone Encounter (Signed)
Will defer this to PCP upon his return

## 2021-01-30 ENCOUNTER — Telehealth: Payer: Self-pay | Admitting: Family Medicine

## 2021-01-30 NOTE — Telephone Encounter (Signed)
Home Health Verbal Orders - Caller/Agency: [facility] Callback Number:[phone] Requesting PT Frequency:1x8

## 2021-02-02 NOTE — Telephone Encounter (Signed)
This may be duplicate message, there was another phone call about orders  Okay to proceed  Nobie Putnam, [facility] 02/02/2021, 11:55 AM

## 2021-02-02 NOTE — Telephone Encounter (Signed)
I was sent to voicemail therefore I left a detailed message with orders to proceed per Dr. Raliegh Ip.

## 2021-02-02 NOTE — Telephone Encounter (Signed)
I don't know of any particular additional orders.  If [facility] doctors have already given verbal orders, it is okay to proceed with current orders  Nobie Putnam, [facility] 02/02/2021, 11:54 AM

## 2021-02-09 ENCOUNTER — Ambulatory Visit (INDEPENDENT_AMBULATORY_CARE_PROVIDER_SITE_OTHER): Payer: Medicare Other | Admitting: Family Medicine

## 2021-02-09 ENCOUNTER — Other Ambulatory Visit: Payer: Self-pay

## 2021-02-09 ENCOUNTER — Encounter: Payer: Self-pay | Admitting: Family Medicine

## 2021-02-09 VITALS — BP 124/96 | HR 80 | Ht 63.0 in | Wt 173.0 lb

## 2021-02-09 DIAGNOSIS — K922 Gastrointestinal hemorrhage, unspecified: Secondary | ICD-10-CM | POA: Diagnosis not present

## 2021-02-09 DIAGNOSIS — K627 Radiation proctitis: Secondary | ICD-10-CM | POA: Diagnosis not present

## 2021-02-09 DIAGNOSIS — R7989 Other specified abnormal findings of blood chemistry: Secondary | ICD-10-CM

## 2021-02-09 DIAGNOSIS — I10 Essential (primary) hypertension: Secondary | ICD-10-CM | POA: Diagnosis not present

## 2021-02-09 DIAGNOSIS — U099 Post covid-19 condition, unspecified: Secondary | ICD-10-CM

## 2021-02-09 DIAGNOSIS — R053 Chronic cough: Secondary | ICD-10-CM

## 2021-02-09 DIAGNOSIS — J9801 Acute bronchospasm: Secondary | ICD-10-CM

## 2021-02-09 DIAGNOSIS — D62 Acute posthemorrhagic anemia: Secondary | ICD-10-CM

## 2021-02-09 MED ORDER — PREDNISONE 10 MG PO TABS: ORAL_TABLET | ORAL | 0 refills | Status: DC

## 2021-02-09 NOTE — Telephone Encounter (Signed)
Vania Rea calling from [facility] is calling about the orders. She states since the orders where faxed that they would need to be returned with a signature For PT eval.  CB- [phone]

## 2021-02-09 NOTE — Progress Notes (Signed)
Subjective:    Patient ID: April Blake, female    DOB: 1940/10/24, 80 y.o.   MRN: [id number]  April Blake is a 80 y.o. female presenting on 02/09/2021 for Hospitalization Follow-up   HPI  HOSPITAL FOLLOW-UP VISIT  Hospital/Location: [facility] Date of Admission: 01/21/21 Date of Discharge: 01/29/21 Transitions of care telephone call: 01/30/21 Vonda Antigua RN [facility] completed.  Reason for Admission: Rectal Bleeding GI [facility] H&P and Discharge Summary have been reviewed - Patient presents today 11 days after recent hospitalization. Brief summary of recent course, patient had symptoms of rectal bleeding.  Tested COVID19 positive, had an early cough at that time.  Dx with Radiation Proctitis. No other treatment other than the enemas.  Awaiting apt for General GI specialist through [facility]. She already has Hepatology/Liver. They are working on scheduling that. Has upcoming apt next week with Liver specialist 11/14.  Has upcoming Hematology apt tomorrow 11/8 with blood draw. For the requested CBC + CMET  Last labs were done on 10/27  On discharge Mesalamine enema nightly for 6 weeks  She has done an enema on Tues, Thurs Sat. She felt that the enemas were worsening her symptoms and provoking. Also Tramadol was decreased to 25mg  PRN They advised to hold Spironlactone 100mg  due to low BP and fluctuating renal function. CBC + CMET Rectal prolapse.  Passing huge clots, rectal bleeding. Seems enemas at home were making bleeding worse, now stopped and bleeding much improved. Still some mild bleed.  - Today reports overall has done well after discharge. Symptoms of bleeding has improved but still present.  Osteoarthritis Admits back pain persistent [facility] lowered it from 50 to 25mg , should reduce dose to 25mg  to avoid bleeding risk.  History of COVID / Cough  I have reviewed the discharge medication list, and have reconciled the current and discharge medications  today.   Current Outpatient Medications:    ACCU-CHEK GUIDE test strip, 1 each 4 (four) times daily., Disp: , Rfl:    acetaminophen (TYLENOL) 500 MG tablet, Take by mouth., Disp: , Rfl:    albuterol (VENTOLIN HFA) 108 (90 Base) MCG/ACT inhaler, , Disp: , Rfl:    bisacodyl (DULCOLAX) 5 MG EC tablet, Take by mouth., Disp: , Rfl:    Budeson-Glycopyrrol-Formoterol (BREZTRI AEROSPHERE) 160-9-4.8 MCG/ACT AERO, 2 puffs, Disp: , Rfl:    Coenzyme Q10 (CO Q-10) 200 MG CAPS, 1 capsule with a meal, Disp: , Rfl:    Cysteamine Bitartrate (PROCYSBI) 300 MG PACK, Use 1 each 3 (three) times daily Use as instructed., Disp: , Rfl:    diclofenac Sodium (VOLTAREN) 1 % GEL, Apply 2 g topically 4 (four) times daily as needed (osteoarthritis knee pain)., Disp: 100 g, Rfl: 2   ferrous sulfate 325 (65 FE) MG tablet, 1 tablet, Disp: , Rfl:    furosemide (LASIX) 20 MG tablet, Take 60 mg by mouth daily., Disp: , Rfl:    glucose blood (ACCU-CHEK AVIVA PLUS) test strip, , Disp: , Rfl:    glucose blood (ACCU-CHEK GUIDE) test strip, Check BS 4/day -Fluctuating blood sugars, Disp: , Rfl:    insulin glargine (LANTUS SOLOSTAR) 100 UNIT/ML Solostar Pen, 20 units in the morning, Disp: , Rfl:    insulin regular (NOVOLIN R) 100 units/mL injection, See admin instructions., Disp: , Rfl:    levothyroxine (SYNTHROID) 75 MCG tablet, 1 tablet in the morning on an empty stomach, Disp: , Rfl:    omeprazole (PRILOSEC) 40 MG capsule, Take 1 capsule (40 mg total) by  mouth 2 (two) times daily before a meal., Disp: 180 capsule, Rfl: 1   ondansetron (ZOFRAN) 4 MG tablet, , Disp: , Rfl:    polyethylene glycol powder (GLYCOLAX/MIRALAX) 17 GM/SCOOP powder, Take by mouth., Disp: , Rfl:    predniSONE (DELTASONE) 10 MG tablet, Take 6 tabs with breakfast Day 1, 5 tabs Day 2, 4 tabs Day 3, 3 tabs Day 4, 2 tabs Day 5, 1 tab Day 6., Disp: 21 tablet, Rfl: 0   propranolol (INDERAL) 10 MG tablet, 1 tablet on an empty stomach, Disp: , Rfl:    simvastatin  (ZOCOR) 20 MG tablet, Take 1 tablet (20 mg total) by mouth at bedtime., Disp: 90 tablet, Rfl: 3   spironolactone (ALDACTONE) 50 MG tablet, Take 100 mg by mouth daily., Disp: , Rfl:   ------------------------------------------------------------------------- Social History   Tobacco Use   Smoking status: Former    Packs/day: 0.20    Years: 5.00    Pack years: 1.00    Types: Cigarettes   Smokeless tobacco: Never  Substance Use Topics   Alcohol use: Never   Drug use: Never    Review of Systems Per HPI unless specifically indicated above     Objective:    BP (!) 124/96   Pulse 80   Ht 5\' 3"  (1.6 m)   Wt 173 lb (78.5 kg)   SpO2 100%   BMI 30.65 kg/m   Wt Readings from Last 3 Encounters:  02/09/21 173 lb (78.5 kg)  01/12/21 173 lb (78.5 kg)  10/14/20 174 lb 9.6 oz (79.2 kg)    Physical Exam Vitals and nursing note reviewed.  Constitutional:      General: She is not in acute distress.    Appearance: She is well-developed. She is obese. She is not diaphoretic.     Comments: Well-appearing, comfortable, cooperative  HENT:     Head: Normocephalic and atraumatic.  Eyes:     General:        Right eye: No discharge.        Left eye: No discharge.     Conjunctiva/sclera: Conjunctivae normal.  Neck:     Thyroid: No thyromegaly.  Cardiovascular:     Rate and Rhythm: Normal rate and regular rhythm.     Heart sounds: Normal heart sounds. No murmur heard. Pulmonary:     Effort: Pulmonary effort is normal. No respiratory distress.     Breath sounds: No wheezing or rales.     Comments: Coughing,. Mild coarse breath sounds Musculoskeletal:        General: Normal range of motion.     Cervical back: Normal range of motion and neck supple.  Lymphadenopathy:     Cervical: No cervical adenopathy.  Skin:    General: Skin is warm and dry.     Findings: No erythema or rash.  Neurological:     Mental Status: She is alert and oriented to person, place, and time.  Psychiatric:         Behavior: Behavior normal.     Comments: Well groomed, good eye contact, normal speech and thoughts      Results for orders placed or performed in visit on 01/12/21  Prepare RBC (crossmatch)  Result Value Ref Range   Order Confirmation      ORDER PROCESSED BY BLOOD BANK Performed at [facility], [address] [place], [place] [address]   Type and screen  Result Value Ref Range   ABO/RH(D) A POS    Antibody Screen NEG  Sample Expiration 01/15/2021,2359    Unit Number [id number]    Blood Component Type RED CELLS,LR    Unit division 00    Status of Unit ISSUED,FINAL    Transfusion Status OK TO TRANSFUSE    Crossmatch Result Compatible    Unit Number [id number]    Blood Component Type RED CELLS,LR    Unit division 00    Status of Unit ISSUED,FINAL    Transfusion Status OK TO TRANSFUSE    Crossmatch Result      Compatible Performed at [facility], [place], [place] [address]   BPAM [facility]  Result Value Ref Range   ISSUE DATE / TIME [id number]    Blood Product Unit Number [id number]    PRODUCT CODE D1497W26    Unit Type and Rh 6200    Blood Product Expiration Date [id number]    ISSUING PHYSICIAN [facility]    ISSUE DATE / TIME [id number]    Blood Product Unit Number [id number]    PRODUCT CODE T6546T03    Unit Type and Rh 6200    Blood Product Expiration Date 546568127517    ISSUING PHYSICIAN ^BRAHMANDAY^GOVINDA^R       Assessment & Plan:   Problem List Items Addressed This Visit     Essential hypertension   Relevant Orders   COMPLETE METABOLIC PANEL WITH GFR   Other Visit Diagnoses     Radiation proctitis    -  Primary   Elevated serum creatinine       Relevant Orders   COMPLETE METABOLIC PANEL WITH GFR   Acute lower GI bleeding       ABLA (acute blood loss anemia)       Post-COVID chronic cough       Relevant Medications   predniSONE (DELTASONE) 10 MG tablet   Bronchospasm        Relevant Medications   predniSONE (DELTASONE) 10 MG tablet      Hx COVID / Persistent Cough Bronchospasm Will add Prednisone taper 60 to 10mg  for cough now  ABLA Improved hgb on discharge Lab with CBC tomorrow per Hematology No further significant bleeding GI bleed, now improved, reassuring  Radiation proactitis Underlying cause, discussed diagnosis and treatment Limited success on mesalamine enema was provoking the bleeding more, now stopped and bleeding stopped. Or improved She has upcoming apt to be scheduled w/ [facility], awaiting updates  Elevated Serum Creatinine vs AKI Lab today for CMET with chemistry Follow Cr trend, and may restart Spironolactone 100mg  daily (50mg  x 2) once review of lab since her BP is elevated recently after hospital held medication.  Regarding chronic pain - we can consider restart Tramadol if preferred at lower dose 25mg , as Tramadol does have a possible tendency to impact some pathways that may increase risk of GI bleed by impacting platelets.  We could consider low dose Oxy IR 5mg  or back to Tramadol 25mg  with caution.  Meds ordered this encounter  Medications   predniSONE (DELTASONE) 10 MG tablet    Sig: Take 6 tabs with breakfast Day 1, 5 tabs Day 2, 4 tabs Day 3, 3 tabs Day 4, 2 tabs Day 5, 1 tab Day 6.    Dispense:  21 tablet    Refill:  0     Follow up plan: Return if symptoms worsen or fail to improve.   Nobie Putnam, [facility] 02/09/2021, 2:54 PM

## 2021-02-09 NOTE — Patient Instructions (Addendum)
Thank you for coming to the office today.  Lab today CMET for chemistry  Contact us to remind / review results and inquire about restarting Spironolactone 50mg  x2 = 100mg  and also Tramadol.  Start Prednisone taper 6 days  Okay to take Tylenol  Please schedule a Follow-up Appointment to: Return if symptoms worsen or fail to improve.  If you have any other questions or concerns, please feel free to call the office or send a message through [facility]. You may also schedule an earlier appointment if necessary.  Additionally, you may be receiving a survey about your experience at our office within a few days to 1 week by e-mail or mail. We value your feedback.  Nobie Putnam, DO [facility]

## 2021-02-10 ENCOUNTER — Telehealth: Payer: Self-pay | Admitting: Family Medicine

## 2021-02-10 ENCOUNTER — Inpatient Hospital Stay: Payer: Medicare Other | Attending: Oncology

## 2021-02-10 DIAGNOSIS — K7581 Nonalcoholic steatohepatitis (NASH): Secondary | ICD-10-CM | POA: Diagnosis present

## 2021-02-10 DIAGNOSIS — K31819 Angiodysplasia of stomach and duodenum without bleeding: Secondary | ICD-10-CM | POA: Diagnosis present

## 2021-02-10 DIAGNOSIS — D649 Anemia, unspecified: Secondary | ICD-10-CM

## 2021-02-10 DIAGNOSIS — D508 Other iron deficiency anemias: Secondary | ICD-10-CM | POA: Insufficient documentation

## 2021-02-10 LAB — CBC WITH DIFFERENTIAL/PLATELET
Abs Immature Granulocytes: 0.01 10*3/uL (ref 0.00–0.07)
Basophils Absolute: 0 10*3/uL (ref 0.0–0.1)
Basophils Relative: 0 %
Eosinophils Absolute: 0.1 10*3/uL (ref 0.0–0.5)
Eosinophils Relative: 4 %
HCT: 21.5 % — ABNORMAL LOW (ref 36.0–46.0)
Hemoglobin: 6.5 g/dL — ABNORMAL LOW (ref 12.0–15.0)
Immature Granulocytes: 0 %
Lymphocytes Relative: 8 %
Lymphs Abs: 0.3 10*3/uL — ABNORMAL LOW (ref 0.7–4.0)
MCH: 27.4 pg (ref 26.0–34.0)
MCHC: 30.2 g/dL (ref 30.0–36.0)
MCV: 90.7 fL (ref 80.0–100.0)
Monocytes Absolute: 0.3 10*3/uL (ref 0.1–1.0)
Monocytes Relative: 10 %
Neutro Abs: 2.3 10*3/uL (ref 1.7–7.7)
Neutrophils Relative %: 78 %
Platelets: 113 10*3/uL — ABNORMAL LOW (ref 150–400)
RBC: 2.37 MIL/uL — ABNORMAL LOW (ref 3.87–5.11)
RDW: 18.7 % — ABNORMAL HIGH (ref 11.5–15.5)
WBC: 3 10*3/uL — ABNORMAL LOW (ref 4.0–10.5)
nRBC: 0 % (ref 0.0–0.2)

## 2021-02-10 LAB — SAMPLE TO BLOOD BANK

## 2021-02-10 LAB — COMPLETE METABOLIC PANEL WITH GFR
AG Ratio: 0.8 (calc) — ABNORMAL LOW (ref 1.0–2.5)
ALT: 10 U/L (ref 6–29)
AST: 29 U/L (ref 10–35)
Albumin: 2.5 g/dL — ABNORMAL LOW (ref 3.6–5.1)
Alkaline phosphatase (APISO): 88 U/L (ref 37–153)
BUN/Creatinine Ratio: 25 (calc) — ABNORMAL HIGH (ref 6–22)
BUN: 31 mg/dL — ABNORMAL HIGH (ref 7–25)
CO2: 23 mmol/L (ref 20–32)
Calcium: 7.8 mg/dL — ABNORMAL LOW (ref 8.6–10.4)
Chloride: 107 mmol/L (ref 98–110)
Creat: 1.23 mg/dL — ABNORMAL HIGH (ref 0.60–1.00)
Globulin: 3.1 g/dL (calc) (ref 1.9–3.7)
Glucose, Bld: 186 mg/dL — ABNORMAL HIGH (ref 65–139)
Potassium: 3.7 mmol/L (ref 3.5–5.3)
Sodium: 140 mmol/L (ref 135–146)
Total Bilirubin: 1.3 mg/dL — ABNORMAL HIGH (ref 0.2–1.2)
Total Protein: 5.6 g/dL — ABNORMAL LOW (ref 6.1–8.1)
eGFR: 45 mL/min/{1.73_m2} — ABNORMAL LOW (ref >=60)

## 2021-02-10 NOTE — Telephone Encounter (Signed)
Patients daughter called for lab results, not seeing any released. Please call back

## 2021-02-11 ENCOUNTER — Inpatient Hospital Stay: Payer: Medicare Other

## 2021-02-11 ENCOUNTER — Other Ambulatory Visit: Payer: Self-pay

## 2021-02-11 ENCOUNTER — Other Ambulatory Visit: Payer: Self-pay | Admitting: Oncology

## 2021-02-11 ENCOUNTER — Telehealth: Payer: Self-pay | Admitting: Family Medicine

## 2021-02-11 DIAGNOSIS — D508 Other iron deficiency anemias: Secondary | ICD-10-CM | POA: Diagnosis not present

## 2021-02-11 DIAGNOSIS — D649 Anemia, unspecified: Secondary | ICD-10-CM

## 2021-02-11 LAB — PREPARE RBC (CROSSMATCH)

## 2021-02-11 MED ORDER — ACETAMINOPHEN 325 MG PO TABS
650.0000 mg | ORAL_TABLET | Freq: Once | ORAL | Status: AC
  Administered 2021-02-11: 650 mg via ORAL
  Filled 2021-02-11: qty 2

## 2021-02-11 MED ORDER — DIPHENHYDRAMINE HCL 50 MG/ML IJ SOLN
25.0000 mg | Freq: Once | INTRAMUSCULAR | Status: AC
  Administered 2021-02-11: 25 mg via INTRAVENOUS
  Filled 2021-02-11: qty 1

## 2021-02-11 MED ORDER — SODIUM CHLORIDE 0.9% IV SOLUTION
250.0000 mL | Freq: Once | INTRAVENOUS | Status: AC
  Administered 2021-02-11: 250 mL via INTRAVENOUS
  Filled 2021-02-11: qty 250

## 2021-02-11 NOTE — Telephone Encounter (Signed)
Ginger called wanting to know if her mother's labs had come back. She also needed to know if she should restart the Spironolactone 100mg  daily  and Tramadol.

## 2021-02-11 NOTE — Patient Instructions (Signed)
Blood Transfusion, Adult, Care After This sheet gives you information about how to care for yourself after your procedure. Your doctor may also give you more specific instructions. If you have problems or questions, contact your doctor. What can I expect after the procedure? After the procedure, it is common to have: Bruising and soreness at the IV site. A headache. Follow these instructions at home: Insertion site care   Follow instructions from your doctor about how to take care of your insertion site. This is where an IV tube was put into your vein. Make sure you: Wash your hands with soap and water before and after you change your bandage (dressing). If you cannot use soap and water, use hand sanitizer. Change your bandage as told by your doctor. Check your insertion site every day for signs of infection. Check for: Redness, swelling, or pain. Bleeding from the site. Warmth. Pus or a bad smell. General instructions Take over-the-counter and prescription medicines only as told by your doctor. Rest as told by your doctor. Go back to your normal activities as told by your doctor. Keep all follow-up visits as told by your doctor. This is important. Contact a doctor if: You have itching or red, swollen areas of skin (hives). You feel worried or nervous (anxious). You feel weak after doing your normal activities. You have redness, swelling, warmth, or pain around the insertion site. You have blood coming from the insertion site, and the blood does not stop with pressure. You have pus or a bad smell coming from the insertion site. Get help right away if: You have signs of a serious reaction. This may be coming from an allergy or the body's defense system (immune system). Signs include: Trouble breathing or shortness of breath. Swelling of the face or feeling warm (flushed). Fever or chills. Head, chest, or back pain. Dark pee (urine) or blood in the pee. Widespread rash. Fast  heartbeat. Feeling dizzy or light-headed. You may receive your blood transfusion in an outpatient setting. If so, you will be told whom to contact to report any reactions. These symptoms may be an emergency. Do not wait to see if the symptoms will go away. Get medical help right away. Call your local emergency services (911 in the [place] Do not drive yourself to the hospital. Summary Bruising and soreness at the IV site are common. Check your insertion site every day for signs of infection. Rest as told by your doctor. Go back to your normal activities as told by your doctor. Get help right away if you have signs of a serious reaction. This information is not intended to replace advice given to you by your health care provider. Make sure you discuss any questions you have with your health care provider. Document Revised: 07/17/2020 Document Reviewed: 09/14/2018 Elsevier Patient Education  2022 Elsevier Inc.  

## 2021-02-11 NOTE — Telephone Encounter (Signed)
Called patient back spoke with daughter Ginger.  Creatinine improved from 1.6 down to 1.2, K normal range.  She can restart Spironolatone daily. Due to high BP. Ease into it with Spironolactone 50mg  daily at first and may switch to x 2 = 100mg  when ready if BP elevated still.  Discussed research into Tramadol rare but possible to increase GI bleed risk through receptor mechanism.  She has been on Tramadol >20 years low infrequent dose. We mutually agree keep Tramadol low dose half tab of 50 for 25mg  PRN rare use. And use topicals and other relief agents.  Offered Oxy IR 5mg  PRN but we agree defer for now. Patient not interested in other opiates.  Of note reviewed CBC, and severe anemia, already had [facility]BC Transfusion per [facility] Hematology  Nobie Putnam, [facility] 02/11/2021, 5:53 PM

## 2021-02-12 LAB — BPAM RBC
Blood Product Expiration Date: 202212052359
ISSUE DATE / TIME: 202211091029
Unit Type and Rh: 6200

## 2021-02-12 LAB — TYPE AND SCREEN
ABO/RH(D): A POS
Antibody Screen: NEGATIVE
Unit division: 0

## 2021-02-17 ENCOUNTER — Other Ambulatory Visit: Payer: Self-pay | Admitting: Emergency Medicine

## 2021-02-17 ENCOUNTER — Encounter: Payer: Self-pay | Admitting: Oncology

## 2021-02-17 DIAGNOSIS — D649 Anemia, unspecified: Secondary | ICD-10-CM

## 2021-03-01 ENCOUNTER — Emergency Department: Payer: Medicare Other

## 2021-03-01 ENCOUNTER — Other Ambulatory Visit: Payer: Self-pay

## 2021-03-01 ENCOUNTER — Inpatient Hospital Stay
Admission: EM | Admit: 2021-03-01 | Discharge: 2021-03-05 | DRG: 377 | Disposition: A | Discharge status: Home Health | Payer: Medicare Other | Attending: Internal Medicine | Admitting: Internal Medicine

## 2021-03-01 DIAGNOSIS — N189 Chronic kidney disease, unspecified: Secondary | ICD-10-CM

## 2021-03-01 DIAGNOSIS — Z8616 Personal history of COVID-19: Secondary | ICD-10-CM

## 2021-03-01 DIAGNOSIS — E871 Hypo-osmolality and hyponatremia: Secondary | ICD-10-CM | POA: Diagnosis present

## 2021-03-01 DIAGNOSIS — C541 Malignant neoplasm of endometrium: Secondary | ICD-10-CM | POA: Diagnosis present

## 2021-03-01 DIAGNOSIS — Z79890 Hormone replacement therapy (postmenopausal): Secondary | ICD-10-CM

## 2021-03-01 DIAGNOSIS — I959 Hypotension, unspecified: Secondary | ICD-10-CM | POA: Diagnosis present

## 2021-03-01 DIAGNOSIS — E1121 Type 2 diabetes mellitus with diabetic nephropathy: Secondary | ICD-10-CM | POA: Diagnosis present

## 2021-03-01 DIAGNOSIS — E785 Hyperlipidemia, unspecified: Secondary | ICD-10-CM | POA: Diagnosis present

## 2021-03-01 DIAGNOSIS — Z20822 Contact with and (suspected) exposure to covid-19: Secondary | ICD-10-CM | POA: Diagnosis present

## 2021-03-01 DIAGNOSIS — E039 Hypothyroidism, unspecified: Secondary | ICD-10-CM | POA: Diagnosis present

## 2021-03-01 DIAGNOSIS — K31811 Angiodysplasia of stomach and duodenum with bleeding: Secondary | ICD-10-CM | POA: Diagnosis not present

## 2021-03-01 DIAGNOSIS — K766 Portal hypertension: Secondary | ICD-10-CM | POA: Diagnosis present

## 2021-03-01 DIAGNOSIS — D649 Anemia, unspecified: Secondary | ICD-10-CM

## 2021-03-01 DIAGNOSIS — D62 Acute posthemorrhagic anemia: Secondary | ICD-10-CM | POA: Diagnosis present

## 2021-03-01 DIAGNOSIS — Z66 Do not resuscitate: Secondary | ICD-10-CM | POA: Diagnosis not present

## 2021-03-01 DIAGNOSIS — Z794 Long term (current) use of insulin: Secondary | ICD-10-CM

## 2021-03-01 DIAGNOSIS — R680 Hypothermia, not associated with low environmental temperature: Secondary | ICD-10-CM | POA: Diagnosis present

## 2021-03-01 DIAGNOSIS — K520 Gastroenteritis and colitis due to radiation: Secondary | ICD-10-CM | POA: Diagnosis present

## 2021-03-01 DIAGNOSIS — K627 Radiation proctitis: Secondary | ICD-10-CM | POA: Diagnosis present

## 2021-03-01 DIAGNOSIS — Y842 Radiological procedure and radiotherapy as the cause of abnormal reaction of the patient, or of later complication, without mention of misadventure at the time of the procedure: Secondary | ICD-10-CM | POA: Diagnosis present

## 2021-03-01 DIAGNOSIS — G9341 Metabolic encephalopathy: Secondary | ICD-10-CM | POA: Diagnosis present

## 2021-03-01 DIAGNOSIS — I129 Hypertensive chronic kidney disease with stage 1 through stage 4 chronic kidney disease, or unspecified chronic kidney disease: Secondary | ICD-10-CM | POA: Diagnosis present

## 2021-03-01 DIAGNOSIS — K7460 Unspecified cirrhosis of liver: Secondary | ICD-10-CM | POA: Diagnosis present

## 2021-03-01 DIAGNOSIS — N179 Acute kidney failure, unspecified: Secondary | ICD-10-CM | POA: Diagnosis present

## 2021-03-01 DIAGNOSIS — K7581 Nonalcoholic steatohepatitis (NASH): Secondary | ICD-10-CM | POA: Diagnosis present

## 2021-03-01 DIAGNOSIS — R531 Weakness: Secondary | ICD-10-CM

## 2021-03-01 DIAGNOSIS — Z85828 Personal history of other malignant neoplasm of skin: Secondary | ICD-10-CM

## 2021-03-01 DIAGNOSIS — N1832 Chronic kidney disease, stage 3b: Secondary | ICD-10-CM | POA: Diagnosis present

## 2021-03-01 DIAGNOSIS — E1122 Type 2 diabetes mellitus with diabetic chronic kidney disease: Secondary | ICD-10-CM | POA: Diagnosis present

## 2021-03-01 DIAGNOSIS — E8770 Fluid overload, unspecified: Secondary | ICD-10-CM | POA: Diagnosis present

## 2021-03-01 DIAGNOSIS — Z515 Encounter for palliative care: Secondary | ICD-10-CM

## 2021-03-01 DIAGNOSIS — I8510 Secondary esophageal varices without bleeding: Secondary | ICD-10-CM | POA: Diagnosis present

## 2021-03-01 DIAGNOSIS — Z87891 Personal history of nicotine dependence: Secondary | ICD-10-CM

## 2021-03-01 DIAGNOSIS — Z79899 Other long term (current) drug therapy: Secondary | ICD-10-CM

## 2021-03-01 DIAGNOSIS — E1140 Type 2 diabetes mellitus with diabetic neuropathy, unspecified: Secondary | ICD-10-CM | POA: Diagnosis present

## 2021-03-01 DIAGNOSIS — C55 Malignant neoplasm of uterus, part unspecified: Secondary | ICD-10-CM | POA: Diagnosis present

## 2021-03-01 DIAGNOSIS — K31819 Angiodysplasia of stomach and duodenum without bleeding: Secondary | ICD-10-CM | POA: Diagnosis present

## 2021-03-01 LAB — CBC WITH DIFFERENTIAL/PLATELET
Abs Immature Granulocytes: 0.02 10*3/uL (ref 0.00–0.07)
Basophils Absolute: 0 10*3/uL (ref 0.0–0.1)
Basophils Relative: 1 %
Eosinophils Absolute: 0.2 10*3/uL (ref 0.0–0.5)
Eosinophils Relative: 4 %
HCT: 17.1 % — ABNORMAL LOW (ref 36.0–46.0)
Hemoglobin: 5 g/dL — ABNORMAL LOW (ref 12.0–15.0)
Immature Granulocytes: 1 %
Lymphocytes Relative: 11 %
Lymphs Abs: 0.5 10*3/uL — ABNORMAL LOW (ref 0.7–4.0)
MCH: 27.3 pg (ref 26.0–34.0)
MCHC: 29.2 g/dL — ABNORMAL LOW (ref 30.0–36.0)
MCV: 93.4 fL (ref 80.0–100.0)
Monocytes Absolute: 0.5 10*3/uL (ref 0.1–1.0)
Monocytes Relative: 11 %
Neutro Abs: 3.3 10*3/uL (ref 1.7–7.7)
Neutrophils Relative %: 72 %
Platelets: 170 10*3/uL (ref 150–400)
RBC: 1.83 MIL/uL — ABNORMAL LOW (ref 3.87–5.11)
RDW: 18.8 % — ABNORMAL HIGH (ref 11.5–15.5)
WBC: 4.4 10*3/uL (ref 4.0–10.5)
nRBC: 0 % (ref 0.0–0.2)

## 2021-03-01 LAB — COMPREHENSIVE METABOLIC PANEL
ALT: 15 U/L (ref 0–44)
AST: 25 U/L (ref 15–41)
Albumin: 2.4 g/dL — ABNORMAL LOW (ref 3.5–5.0)
Alkaline Phosphatase: 73 U/L (ref 38–126)
Anion gap: 5 (ref 5–15)
BUN: 37 mg/dL — ABNORMAL HIGH (ref 8–23)
CO2: 27 mmol/L (ref 22–32)
Calcium: 8.4 mg/dL — ABNORMAL LOW (ref 8.9–10.3)
Chloride: 97 mmol/L — ABNORMAL LOW (ref 98–111)
Creatinine, Ser: 1.74 mg/dL — ABNORMAL HIGH (ref 0.44–1.00)
GFR, Estimated: 30 mL/min — ABNORMAL LOW (ref >60)
Glucose, Bld: 173 mg/dL — ABNORMAL HIGH (ref 70–99)
Potassium: 4.6 mmol/L (ref 3.5–5.1)
Sodium: 129 mmol/L — ABNORMAL LOW (ref 135–145)
Total Bilirubin: 1.7 mg/dL — ABNORMAL HIGH (ref 0.3–1.2)
Total Protein: 5.8 g/dL — ABNORMAL LOW (ref 6.5–8.1)

## 2021-03-01 LAB — RESP PANEL BY RT-PCR (FLU A&B, COVID) ARPGX2
Influenza A by PCR: NEGATIVE
Influenza B by PCR: NEGATIVE
SARS Coronavirus 2 by RT PCR: NEGATIVE

## 2021-03-01 LAB — TROPONIN I (HIGH SENSITIVITY)
Troponin I (High Sensitivity): 4 ng/L (ref <18)
Troponin I (High Sensitivity): 4 ng/L (ref <18)

## 2021-03-01 LAB — PREPARE RBC (CROSSMATCH)

## 2021-03-01 LAB — BRAIN NATRIURETIC PEPTIDE: B Natriuretic Peptide: 209.6 pg/mL — ABNORMAL HIGH (ref 0.0–100.0)

## 2021-03-01 MED ORDER — SODIUM CHLORIDE 0.9 % IV SOLN
10.0000 mL/h | Freq: Once | INTRAVENOUS | Status: AC
  Administered 2021-03-03: 10 mL/h via INTRAVENOUS

## 2021-03-01 MED ORDER — LORAZEPAM 2 MG/ML IJ SOLN
0.5000 mg | Freq: Once | INTRAMUSCULAR | Status: AC
  Administered 2021-03-01: 23:00:00 0.5 mg via INTRAVENOUS
  Filled 2021-03-01: qty 1

## 2021-03-01 MED ORDER — TRAMADOL HCL 50 MG PO TABS
50.0000 mg | ORAL_TABLET | Freq: Once | ORAL | Status: AC
  Administered 2021-03-01: 50 mg via ORAL
  Filled 2021-03-01: qty 1

## 2021-03-01 MED ORDER — SODIUM CHLORIDE 0.9 % IV BOLUS
500.0000 mL | Freq: Once | INTRAVENOUS | Status: AC
  Administered 2021-03-02: 500 mL via INTRAVENOUS

## 2021-03-01 NOTE — ED Provider Notes (Signed)
  Emergency Medicine Provider Triage Evaluation Note  April Blake , a 80 y.o.female,  was evaluated in triage.  Pt complains of weakness.  She is joined by her daughter who states that the patient started feeling unwell yesterday, but around 3 PM the patient began experiencing a sudden onset of difficulty talking and weakness, though denies slurred speech or motor deficits.  Patient has a history of anemia and her recent rectal bleeding has been worse for the past 2 days   Review of Systems  Positive: Weakness. Negative: Denies fever, chest pain, vomiting  Physical Exam   Vitals:   03/01/21 1815 03/01/21 1816  BP: (!) 101/38   Pulse: 65   Resp: 18   Temp:  97.7 F (36.5 C)  SpO2: 98%    Gen:   Awake, no distress   Resp:  Normal effort  MSK:   Moves extremities without difficulty  Other:  Cranial nerves II through XII intact, no slurred speech, +3 pitting edema in the lower extremities.  Lung sounds clear bilaterally  Medical Decision Making  Given the patient's initial medical screening exam, the following diagnostic evaluation has been ordered. The patient will be placed in the appropriate treatment space, once one is available, to complete the evaluation and treatment. I have discussed the plan of care with the patient and I have advised the patient that an ED physician or mid-level practitioner will reevaluate their condition after the test results have been received, as the results may give them additional insight into the type of treatment they may need.    Diagnostics: Head CT, labs, EKG, CXR.  Treatments: none immediately   Teodoro Spray, [place] 03/01/21 Dorian Furnace, MD 03/01/21 2017

## 2021-03-01 NOTE — ED Provider Notes (Signed)
[facility] Emergency Department Provider Note  Time seen: 10:54 PM  I have reviewed the triage vital signs and the nursing notes.   HISTORY  Chief Complaint Weakness   HPI April Blake is a 80 y.o. female with a past medical history of anemia, hypertension, hyperlipidemia, CKD, [place], chronic anemia, chronic blood loss from [place] presents to the emergency department for weakness and somewhat slurred speech.  According to the daughter patient has chronic anemia and requires transfusions every week or 2.  Per record review patient follows up with Dr. Grayland Ormond of hematology who recommends transfusing anytime her hemoglobin drops below 7.0.  Daughter states today patient appears very weak and has somewhat slurred speech which is not typical of her normal low hemoglobins although today her hemoglobin at 5.0 is considerably lower than typical.  Patient denies any chest pain shortness of breath no abdominal pain.  No nausea vomiting.     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/30/2020   Hyperlipidemia associated with type 2 diabetes mellitus ([place]) 09/30/2020   Impaired mobility 09/30/2020   Insomnia 09/30/2020   Osteoarthritis of knee 09/30/2020   Personal history of colonic polyps 09/30/2020   Chronic blood loss anemia 09/27/2020   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   Uterine cancer ([place]) 04/09/2020   Upper GI bleed 03/16/2018   Bilateral lower extremity edema 02/09/2018   Coronary artery calcification seen on CT  scan 12/01/2017   Exertional dyspnea 12/01/2017   Ascites 10/24/2013   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 Authorizing Provider  ACCU-CHEK GUIDE test strip 1 each 4 (four) times daily. 05/19/20   [provider]  acetaminophen (TYLENOL) 500 MG tablet Take by mouth.    [provider]  albuterol (VENTOLIN HFA) 108 (90 Base) MCG/ACT inhaler  09/22/17   [provider]  bisacodyl (DULCOLAX) 5 MG EC tablet Take by mouth.    [provider]  Budeson-Glycopyrrol-Formoterol (BREZTRI AEROSPHERE) 160-9-4.8 MCG/ACT AERO 2 puffs 04/29/20   [provider]  Coenzyme Q10 (CO Q-10) 200 MG CAPS 1 capsule with a meal    [provider]  Cysteamine Bitartrate (PROCYSBI) 300 MG PACK Use 1 each 3 (three) times daily Use as instructed. 03/28/20 03/28/21  [provider]  diclofenac Sodium (VOLTAREN) 1 % GEL Apply 2 g topically 4 (four) times daily as needed (osteoarthritis knee pain). 10/14/20   Karamalegos, Devonne Doughty, DO  ferrous sulfate 325 (65 FE) MG tablet 1 tablet    [provider]  furosemide (LASIX) 20 MG tablet Take 60 mg by mouth daily.    [provider]  glucose blood (ACCU-CHEK AVIVA PLUS) test strip  07/16/17   [provider]  glucose blood (ACCU-CHEK GUIDE) test strip Check BS 4/day -Fluctuating blood sugars 01/06/18   [provider]  insulin glargine (LANTUS SOLOSTAR) 100 UNIT/ML Solostar Pen 20 units in the morning 03/18/18   [provider]  insulin regular (NOVOLIN R) 100 units/mL injection See  admin instructions. 06/27/17   [provider]  levothyroxine (SYNTHROID) 75 MCG tablet 1 tablet in the morning on an empty stomach    [provider]  omeprazole (PRILOSEC) 40 MG capsule Take 1 capsule (40 mg total) by mouth 2 (two) times daily before a meal. 01/15/21   Karamalegos, Devonne Doughty, DO  ondansetron (ZOFRAN) 4 MG tablet  04/02/20   [provider]  polyethylene glycol powder (GLYCOLAX/MIRALAX) 17 GM/SCOOP powder Take by mouth.    [provider]  predniSONE (DELTASONE) 10 MG tablet Take 6 tabs with breakfast Day 1, 5 tabs Day 2, 4 tabs Day 3, 3 tabs Day 4, 2 tabs Day 5, 1 tab Day 6. 02/09/21   Karamalegos, Devonne Doughty, DO  propranolol (INDERAL) 10 MG tablet 1 tablet on an empty stomach 08/15/17   [provider]  simvastatin (ZOCOR) 20 MG tablet Take 1 tablet (20 mg total) by mouth at bedtime. 10/14/20   Karamalegos, Devonne Doughty, DO  spironolactone (ALDACTONE) 50 MG tablet Take 100 mg by mouth daily. 07/03/20   [provider]    No Known Allergies  No family history on file.  Social History Social History   Tobacco Use   Smoking status: Former    Packs/day: 0.20    Years: 5.00    Pack years: 1.00    Types: Cigarettes   Smokeless tobacco: Never  Substance Use Topics   Alcohol use: Never   Drug use: Never    Review of Systems Constitutional: Negative for fever.  Positive for generalized weakness and fatigue  Cardiovascular: Negative for chest pain. Respiratory: Negative for shortness of breath. Gastrointestinal: Negative for abdominal pain, vomiting Musculoskeletal: Negative for musculoskeletal complaints Neurological: Negative for headache All other ROS negative  ____________________________________________   PHYSICAL EXAM:  VITAL SIGNS: ED Triage Vitals  Enc Vitals Group     BP 03/01/21 1815 (!) 101/38     Pulse Rate 03/01/21 1815 65     Resp 03/01/21 1815 18     Temp 03/01/21 1816 97.7 F (36.5 C)      Temp Source 03/01/21 1816 Oral     SpO2 03/01/21 1815 98 %     Weight 03/01/21 1817 180 lb (81.6 kg)     Height 03/01/21 1817 5\' 3"  (1.6 m)     Head Circumference --      Peak Flow --      Pain Score 03/01/21 1815 0     Pain Loc --      Pain Edu? --      Excl. in [place]? --    Constitutional: Patient is awake alert, is pale appearing and sluggish reactions but follows commands and answers questions. Eyes: Normal exam ENT      Head: Normocephalic and atraumatic.      Mouth/Throat: Mucous membranes are moist. Cardiovascular: Normal rate, regular rhythm.  Respiratory: Normal respiratory effort without tachypnea nor retractions. Breath sounds are clear Gastrointestinal: Soft and nontender. No distention.   Musculoskeletal: Nontender with normal range of motion  in all extremities. Neurologic:  Normal speech and language. No gross focal neurologic deficits  Skin:  Skin is warm, dry, pale in appearance. Psychiatric: Mood and affect are normal.   ____________________________________________    EKG  EKG viewed and interpreted by myself.  Shows sinus rhythm at 65 bpm with a narrow QRS, normal axis, normal intervals, no concerning ST changes.  ____________________________________________    RADIOLOGY  Scan head is negative. Left lower lobe atelectasis  ____________________________________________   INITIAL IMPRESSION / ASSESSMENT AND PLAN / ED COURSE  Pertinent labs & imaging results that were available during my care of the patient were reviewed by me and considered in my medical decision making (see chart for details).   Patient presents to the emergency department for generalized weakness fatigue daughter states somewhat slurred speech today.  Speech does not appear to be overly slurred but she does have more slow/sluggish reactions.  Patient's labs have resulted showing a hemoglobin of 5.0 we will transfuse 2 units of blood products, patient agreeable.  Patient's creatinine is  bumped slightly from a baseline we will give 500 cc of normal saline.  We will obtain a urinalysis and obtain MRI of the brain to rule out CVA.  Depending on how the patient is feeling after MRI and transfusion we will decide upon disposition.  Patient care signed out to oncoming provider.  April Blake was evaluated in Emergency Department on 03/01/2021 for the symptoms described in the history of present illness. She was evaluated in the context of the global COVID-19 pandemic, which necessitated consideration that the patient might be at risk for infection with the SARS-CoV-2 virus that causes COVID-19. Institutional protocols and algorithms that pertain to the evaluation of patients at risk for COVID-19 are in a state of rapid change based on information released by regulatory bodies including the CDC and federal and state organizations. These policies and algorithms were followed during the patient's care in the ED.  CRITICAL CARE Performed by: Harvest Dark   Total critical care time: 30 minutes  Critical care time was exclusive of separately billable procedures and treating other patients.  Critical care was necessary to treat or prevent imminent or life-threatening deterioration.  Critical care was time spent personally by me on the following activities: development of treatment plan with patient and/or surrogate as well as nursing, discussions with consultants, evaluation of patient's response to treatment, examination of patient, obtaining history from patient or surrogate, ordering and performing treatments and interventions, ordering and review of laboratory studies, ordering and review of radiographic studies, pulse oximetry and re-evaluation of patient's condition.  ____________________________________________   FINAL CLINICAL IMPRESSION(S) / ED DIAGNOSES  Weakness Symptomatic anemia Low hemoglobin   Harvest Dark, MD 03/01/21 2334

## 2021-03-01 NOTE — ED Triage Notes (Signed)
Pt here with daughter who states that pt woke up this morning and stated that she felt weird but then around 1500 she had trouble talking and weakness- pt has a rectal bleed that was worse 2 days ago but not as bad yesterday- pt has a hx of anemia

## 2021-03-01 NOTE — ED Notes (Signed)
Report to dorian, rn.

## 2021-03-02 ENCOUNTER — Telehealth: Payer: Self-pay | Admitting: Oncology

## 2021-03-02 ENCOUNTER — Inpatient Hospital Stay: Payer: Medicare Other

## 2021-03-02 DIAGNOSIS — I8510 Secondary esophageal varices without bleeding: Secondary | ICD-10-CM | POA: Diagnosis present

## 2021-03-02 DIAGNOSIS — K520 Gastroenteritis and colitis due to radiation: Secondary | ICD-10-CM | POA: Diagnosis present

## 2021-03-02 DIAGNOSIS — D649 Anemia, unspecified: Secondary | ICD-10-CM | POA: Diagnosis present

## 2021-03-02 DIAGNOSIS — D62 Acute posthemorrhagic anemia: Secondary | ICD-10-CM

## 2021-03-02 DIAGNOSIS — C541 Malignant neoplasm of endometrium: Secondary | ICD-10-CM | POA: Diagnosis present

## 2021-03-02 DIAGNOSIS — K921 Melena: Secondary | ICD-10-CM

## 2021-03-02 DIAGNOSIS — I129 Hypertensive chronic kidney disease with stage 1 through stage 4 chronic kidney disease, or unspecified chronic kidney disease: Secondary | ICD-10-CM | POA: Diagnosis present

## 2021-03-02 DIAGNOSIS — E785 Hyperlipidemia, unspecified: Secondary | ICD-10-CM | POA: Diagnosis present

## 2021-03-02 DIAGNOSIS — Z20822 Contact with and (suspected) exposure to covid-19: Secondary | ICD-10-CM | POA: Diagnosis present

## 2021-03-02 DIAGNOSIS — E039 Hypothyroidism, unspecified: Secondary | ICD-10-CM | POA: Diagnosis present

## 2021-03-02 DIAGNOSIS — Z515 Encounter for palliative care: Secondary | ICD-10-CM | POA: Diagnosis not present

## 2021-03-02 DIAGNOSIS — I959 Hypotension, unspecified: Secondary | ICD-10-CM | POA: Diagnosis present

## 2021-03-02 DIAGNOSIS — K766 Portal hypertension: Secondary | ICD-10-CM | POA: Diagnosis present

## 2021-03-02 DIAGNOSIS — N1832 Chronic kidney disease, stage 3b: Secondary | ICD-10-CM | POA: Diagnosis present

## 2021-03-02 DIAGNOSIS — Z7189 Other specified counseling: Secondary | ICD-10-CM | POA: Diagnosis not present

## 2021-03-02 DIAGNOSIS — E1140 Type 2 diabetes mellitus with diabetic neuropathy, unspecified: Secondary | ICD-10-CM | POA: Diagnosis present

## 2021-03-02 DIAGNOSIS — R680 Hypothermia, not associated with low environmental temperature: Secondary | ICD-10-CM | POA: Diagnosis present

## 2021-03-02 DIAGNOSIS — K627 Radiation proctitis: Secondary | ICD-10-CM | POA: Diagnosis present

## 2021-03-02 DIAGNOSIS — N179 Acute kidney failure, unspecified: Secondary | ICD-10-CM | POA: Diagnosis present

## 2021-03-02 DIAGNOSIS — G9341 Metabolic encephalopathy: Secondary | ICD-10-CM | POA: Diagnosis present

## 2021-03-02 DIAGNOSIS — Y842 Radiological procedure and radiotherapy as the cause of abnormal reaction of the patient, or of later complication, without mention of misadventure at the time of the procedure: Secondary | ICD-10-CM | POA: Diagnosis present

## 2021-03-02 DIAGNOSIS — K7581 Nonalcoholic steatohepatitis (NASH): Secondary | ICD-10-CM | POA: Diagnosis present

## 2021-03-02 DIAGNOSIS — Z85828 Personal history of other malignant neoplasm of skin: Secondary | ICD-10-CM | POA: Diagnosis not present

## 2021-03-02 DIAGNOSIS — E1122 Type 2 diabetes mellitus with diabetic chronic kidney disease: Secondary | ICD-10-CM | POA: Diagnosis present

## 2021-03-02 DIAGNOSIS — K31811 Angiodysplasia of stomach and duodenum with bleeding: Secondary | ICD-10-CM | POA: Diagnosis present

## 2021-03-02 DIAGNOSIS — E871 Hypo-osmolality and hyponatremia: Secondary | ICD-10-CM | POA: Diagnosis present

## 2021-03-02 DIAGNOSIS — Z66 Do not resuscitate: Secondary | ICD-10-CM | POA: Diagnosis not present

## 2021-03-02 DIAGNOSIS — K31819 Angiodysplasia of stomach and duodenum without bleeding: Secondary | ICD-10-CM | POA: Diagnosis not present

## 2021-03-02 DIAGNOSIS — Z8616 Personal history of COVID-19: Secondary | ICD-10-CM | POA: Diagnosis not present

## 2021-03-02 LAB — URINALYSIS, ROUTINE W REFLEX MICROSCOPIC
Bacteria, UA: NONE SEEN
Bilirubin Urine: NEGATIVE
Glucose, UA: NEGATIVE mg/dL
Ketones, ur: NEGATIVE mg/dL
Leukocytes,Ua: NEGATIVE
Nitrite: NEGATIVE
Protein, ur: NEGATIVE mg/dL
Specific Gravity, Urine: 1.015 (ref 1.005–1.030)
pH: 7 (ref 5.0–8.0)

## 2021-03-02 LAB — CBC WITH DIFFERENTIAL/PLATELET
Abs Immature Granulocytes: 0.02 10*3/uL (ref 0.00–0.07)
Basophils Absolute: 0 10*3/uL (ref 0.0–0.1)
Basophils Relative: 1 %
Eosinophils Absolute: 0.1 10*3/uL (ref 0.0–0.5)
Eosinophils Relative: 4 %
HCT: 21.3 % — ABNORMAL LOW (ref 36.0–46.0)
Hemoglobin: 6.6 g/dL — ABNORMAL LOW (ref 12.0–15.0)
Immature Granulocytes: 1 %
Lymphocytes Relative: 12 %
Lymphs Abs: 0.3 10*3/uL — ABNORMAL LOW (ref 0.7–4.0)
MCH: 27.2 pg (ref 26.0–34.0)
MCHC: 31 g/dL (ref 30.0–36.0)
MCV: 87.7 fL (ref 80.0–100.0)
Monocytes Absolute: 0.3 10*3/uL (ref 0.1–1.0)
Monocytes Relative: 12 %
Neutro Abs: 1.6 10*3/uL — ABNORMAL LOW (ref 1.7–7.7)
Neutrophils Relative %: 70 %
Platelets: 103 10*3/uL — ABNORMAL LOW (ref 150–400)
RBC: 2.43 MIL/uL — ABNORMAL LOW (ref 3.87–5.11)
RDW: 19 % — ABNORMAL HIGH (ref 11.5–15.5)
WBC: 2.2 10*3/uL — ABNORMAL LOW (ref 4.0–10.5)
nRBC: 0 % (ref 0.0–0.2)

## 2021-03-02 LAB — CBC
HCT: 20.4 % — ABNORMAL LOW (ref 36.0–46.0)
Hemoglobin: 6.4 g/dL — ABNORMAL LOW (ref 12.0–15.0)
MCH: 27.7 pg (ref 26.0–34.0)
MCHC: 31.4 g/dL (ref 30.0–36.0)
MCV: 88.3 fL (ref 80.0–100.0)
Platelets: 98 10*3/uL — ABNORMAL LOW (ref 150–400)
RBC: 2.31 MIL/uL — ABNORMAL LOW (ref 3.87–5.11)
RDW: 18.7 % — ABNORMAL HIGH (ref 11.5–15.5)
WBC: 2 10*3/uL — ABNORMAL LOW (ref 4.0–10.5)
nRBC: 0 % (ref 0.0–0.2)

## 2021-03-02 LAB — PREPARE RBC (CROSSMATCH)

## 2021-03-02 LAB — MRSA NEXT GEN BY PCR, NASAL: MRSA by PCR Next Gen: NOT DETECTED

## 2021-03-02 LAB — HEMOGLOBIN AND HEMATOCRIT, BLOOD
HCT: 23.1 % — ABNORMAL LOW (ref 36.0–46.0)
Hemoglobin: 7.3 g/dL — ABNORMAL LOW (ref 12.0–15.0)

## 2021-03-02 LAB — PROCALCITONIN: Procalcitonin: <0.1 ng/mL

## 2021-03-02 MED ORDER — SODIUM CHLORIDE 0.9 % IV BOLUS
250.0000 mL | Freq: Once | INTRAVENOUS | Status: AC
  Administered 2021-03-02: 05:00:00 250 mL via INTRAVENOUS

## 2021-03-02 MED ORDER — CHLORHEXIDINE GLUCONATE CLOTH 2 % EX PADS
6.0000 | MEDICATED_PAD | Freq: Every day | CUTANEOUS | Status: DC
  Administered 2021-03-02 – 2021-03-04 (×3): 6 via TOPICAL

## 2021-03-02 MED ORDER — SODIUM CHLORIDE 0.9% IV SOLUTION
Freq: Once | INTRAVENOUS | Status: AC
  Filled 2021-03-02: qty 250

## 2021-03-02 MED ORDER — SODIUM CHLORIDE 0.9 % IV BOLUS
500.0000 mL | Freq: Once | INTRAVENOUS | Status: AC
  Administered 2021-03-02: 03:00:00 500 mL via INTRAVENOUS

## 2021-03-02 MED ORDER — POLYETHYLENE GLYCOL 3350 17 G PO PACK
34.0000 g | PACK | Freq: Once | ORAL | Status: AC
  Administered 2021-03-02: 17:00:00 34 g via ORAL
  Filled 2021-03-02: qty 2

## 2021-03-02 MED ORDER — SODIUM CHLORIDE 0.9 % IV SOLN
300.0000 mg | Freq: Once | INTRAVENOUS | Status: AC
  Administered 2021-03-02: 300 mg via INTRAVENOUS
  Filled 2021-03-02: qty 300

## 2021-03-02 NOTE — ED Notes (Signed)
Per blood bank blood can be administered at y-site with 0.9% NS.

## 2021-03-02 NOTE — ED Notes (Signed)
Pt sleeping, resting comfortably in bed, NAD, chest rise & fall. No needs identified at this time. Bed low & locked; call light & personal items within reach. 

## 2021-03-02 NOTE — Consult Note (Addendum)
Cephas Darby, MD [address]  [place]  [place], [place] [address]  Main: [phone]  Fax: [phone]    Gastroenterology Consultation  Referring Provider:     No ref. provider found Primary Care Physician:  Olin Hauser, DO Primary Gastroenterologist: [facility] Reason for Consultation:     Hematochezia        HPI:   April Blake is a 80 y.o. female referred by Dr. Parks Ranger, Devonne Doughty, DO  for consultation & management of hematochezia.  Patient has history of uterine cancer s/p hysterectomy and radiation treatment, metabolic syndrome, decompensated [name] cirrhosis originally diagnosed in 7616, complicated by variceal bleed s/p ligation, volume overload with ascites and edema, portal hypertensive gastropathy, GAVE s/p APC, RFA, history of chronic nonocclusive portal vein thrombosis extending into [facility].  Patient has history of chronic blood loss anemia requiring several blood transfusions, multiple endoscopy procedures.  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  She also underwent upper endoscopy 9/22, underwent RFA of portal hypertensive gastropathy  Patient now presented with 2 days history of hematochezia, dark red blood with clots.  Patient denied any black stools.  Her labs revealed hemoglobin 5 on 11/27 dropped from 7.8 on 11/14, patient is currently receiving blood transfusion.  Patient is found lethargic when I interviewed her.  She denied any abdominal pain but she also reported not feeling well overall.  Patient's daughter who is power of attorney is bedside.  Patient's daughter reported that she has been gradually declining over the last few weeks.  She is deemed not to be a candidate for liver transplant.  Her last appointment with [facility] transplant hepatology was 02/16/2021.  NSAIDs:  None  Antiplts/Anticoagulants/Anti thrombotics: None  GI Procedures: Reviewed under care everywhere  Past Medical History:  Diagnosis Date   Anemia    Heart disease    Hyperlipidemia    Hypertension    Liver disease    Thyroid disease    Uterine cancer ([place])     Past Surgical History:  Procedure Laterality Date   CATARACT EXTRACTION     cataract surg     SKIN CANCER EXCISION     skin cancer removal     surgery to left leg      Prior to Admission medications   Medication Sig Start Date End Date Taking? Authorizing Provider  ACCU-CHEK GUIDE test strip 1 each 4 (four) times daily. 05/19/20   [provider]  acetaminophen (TYLENOL) 500 MG tablet Take 1,000 mg by mouth every 4 (four) hours as needed for mild pain.    [provider]  albuterol (VENTOLIN HFA) 108 (90 Base) MCG/ACT inhaler  09/22/17   [provider]  bisacodyl (DULCOLAX) 5 MG EC tablet Take by mouth. Patient not taking: Reported on 03/02/2021    [provider]  Budeson-Glycopyrrol-Formoterol (BREZTRI AEROSPHERE) 160-9-4.8 MCG/ACT AERO 2 puffs Patient not taking: Reported on 03/02/2021 04/29/20   [provider]  Coenzyme Q10 (CO Q-10) 200 MG CAPS 1 capsule with a meal Patient not taking: Reported on 03/02/2021    [provider]  Cysteamine Bitartrate (PROCYSBI) 300 MG PACK Use 1 each 3 (three) times daily Use as instructed. Patient not taking: Reported on 03/02/2021 03/28/20 03/28/21  [provider]  diclofenac Sodium (VOLTAREN) 1 % GEL Apply 2 g topically 4 (four) times daily as needed (osteoarthritis knee  pain). 10/14/20   Olin Hauser, DO  ferrous sulfate 325 (65 FE) MG tablet 325 mg daily with breakfast.    [provider]  furosemide (LASIX) 20 MG tablet Take 60 mg by mouth daily.    [provider]  glucose blood (ACCU-CHEK AVIVA PLUS) test strip  07/16/17   [provider]  glucose blood (ACCU-CHEK GUIDE)  test strip Check BS 4/day -Fluctuating blood sugars 01/06/18   [provider]  insulin glargine (LANTUS SOLOSTAR) 100 UNIT/ML Solostar Pen 20 units in the morning 03/18/18   [provider]  insulin regular (NOVOLIN R) 100 units/mL injection Inject 6-12 Units into the skin 2 (two) times daily before a meal. 06/27/17   [provider]  ipratropium-albuterol (DUONEB) 0.5-2.5 (3) MG/3ML SOLN Take 3 mLs by nebulization 4 (four) times daily as needed. 01/31/21   [provider]  levothyroxine (SYNTHROID) 75 MCG tablet 1 tablet in the morning on an empty stomach    [provider]  mesalamine (ROWASA) 4 g enema Place 4 g rectally at bedtime. Patient not taking: Reported on 03/02/2021 01/29/21 03/12/21  [provider]  omeprazole (PRILOSEC) 40 MG capsule Take 1 capsule (40 mg total) by mouth 2 (two) times daily before a meal. 01/15/21   Parks Ranger, Devonne Doughty, DO  ondansetron (ZOFRAN) 4 MG tablet  04/02/20   [provider]  polyethylene glycol powder (GLYCOLAX/MIRALAX) 17 GM/SCOOP powder Take by mouth. Patient not taking: Reported on 03/02/2021    [provider]  predniSONE (DELTASONE) 10 MG tablet Take 6 tabs with breakfast Day 1, 5 tabs Day 2, 4 tabs Day 3, 3 tabs Day 4, 2 tabs Day 5, 1 tab Day 6. Patient not taking: Reported on 03/02/2021 02/09/21   Olin Hauser, DO  propranolol (INDERAL) 10 MG tablet Take 10 mg by mouth 2 (two) times daily. 08/15/17   [provider]  simvastatin (ZOCOR) 20 MG tablet Take 1 tablet (20 mg total) by mouth at bedtime. 10/14/20   Karamalegos, Devonne Doughty, DO  spironolactone (ALDACTONE) 50 MG tablet Take 100 mg by mouth daily. 07/03/20   [provider]    Current Facility-Administered Medications:    0.9 %  sodium chloride infusion, 10 mL/hr, Intravenous, Once, Harvest Dark, MD  Current Outpatient Medications:    ACCU-CHEK GUIDE test strip, 1 each 4 (four) times  daily., Disp: , Rfl:    acetaminophen (TYLENOL) 500 MG tablet, Take 1,000 mg by mouth every 4 (four) hours as needed for mild pain., Disp: , Rfl:    albuterol (VENTOLIN HFA) 108 (90 Base) MCG/ACT inhaler, , Disp: , Rfl:    bisacodyl (DULCOLAX) 5 MG EC tablet, Take by mouth. (Patient not taking: Reported on 03/02/2021), Disp: , Rfl:    Budeson-Glycopyrrol-Formoterol (BREZTRI AEROSPHERE) 160-9-4.8 MCG/ACT AERO, 2 puffs (Patient not taking: Reported on 03/02/2021), Disp: , Rfl:    Coenzyme Q10 (CO Q-10) 200 MG CAPS, 1 capsule with a meal (Patient not taking: Reported on 03/02/2021), Disp: , Rfl:    Cysteamine Bitartrate (PROCYSBI) 300 MG PACK, Use 1 each 3 (three) times daily Use as instructed. (Patient not taking: Reported on 03/02/2021), Disp: , Rfl:    diclofenac Sodium (VOLTAREN) 1 % GEL, Apply 2 g topically 4 (four) times daily as needed (osteoarthritis knee pain)., Disp: 100 g, Rfl: 2   ferrous sulfate 325 (65 FE) MG tablet, 325 mg daily with breakfast., Disp: , Rfl:    furosemide (LASIX) 20 MG tablet, Take 60 mg by  mouth daily., Disp: , Rfl:    glucose blood (ACCU-CHEK AVIVA PLUS) test strip, , Disp: , Rfl:    glucose blood (ACCU-CHEK GUIDE) test strip, Check BS 4/day -Fluctuating blood sugars, Disp: , Rfl:    insulin glargine (LANTUS SOLOSTAR) 100 UNIT/ML Solostar Pen, 20 units in the morning, Disp: , Rfl:    insulin regular (NOVOLIN R) 100 units/mL injection, Inject 6-12 Units into the skin 2 (two) times daily before a meal., Disp: , Rfl:    ipratropium-albuterol (DUONEB) 0.5-2.5 (3) MG/3ML SOLN, Take 3 mLs by nebulization 4 (four) times daily as needed., Disp: , Rfl:    levothyroxine (SYNTHROID) 75 MCG tablet, 1 tablet in the morning on an empty stomach, Disp: , Rfl:    mesalamine (ROWASA) 4 g enema, Place 4 g rectally at bedtime. (Patient not taking: Reported on 03/02/2021), Disp: , Rfl:    omeprazole (PRILOSEC) 40 MG capsule, Take 1 capsule (40 mg total) by mouth 2 (two) times daily before  a meal., Disp: 180 capsule, Rfl: 1   ondansetron (ZOFRAN) 4 MG tablet, , Disp: , Rfl:    polyethylene glycol powder (GLYCOLAX/MIRALAX) 17 GM/SCOOP powder, Take by mouth. (Patient not taking: Reported on 03/02/2021), Disp: , Rfl:    predniSONE (DELTASONE) 10 MG tablet, Take 6 tabs with breakfast Day 1, 5 tabs Day 2, 4 tabs Day 3, 3 tabs Day 4, 2 tabs Day 5, 1 tab Day 6. (Patient not taking: Reported on 03/02/2021), Disp: 21 tablet, Rfl: 0   propranolol (INDERAL) 10 MG tablet, Take 10 mg by mouth 2 (two) times daily., Disp: , Rfl:    simvastatin (ZOCOR) 20 MG tablet, Take 1 tablet (20 mg total) by mouth at bedtime., Disp: 90 tablet, Rfl: 3   spironolactone (ALDACTONE) 50 MG tablet, Take 100 mg by mouth daily., Disp: , Rfl:   No family history on file.   Social History   Tobacco Use   Smoking status: Former    Packs/day: 0.20    Years: 5.00    Pack years: 1.00    Types: Cigarettes   Smokeless tobacco: Never  Substance Use Topics   Alcohol use: Never   Drug use: Never    Allergies as of 03/01/2021   (No Known Allergies)    Review of Systems:    All systems reviewed and negative except where noted in HPI.   Physical Exam:  BP (!) 103/51   Pulse 63   Temp (!) 96.6 F (35.9 C) (Rectal)   Resp 14   Ht 5\' 3"  (1.6 m)   Wt 81.6 kg   SpO2 98%   BMI 31.89 kg/m  No LMP recorded. Patient is postmenopausal.  General:   Lethargic, ill-appearing, poorly nourished and cooperative in NAD Head:  Normocephalic and atraumatic. Eyes:  Sclera clear, no icterus.   Conjunctiva pale Ears:  Normal auditory acuity. Nose:  No deformity, discharge, or lesions. Mouth:  No deformity or lesions,oropharynx pink & moist. Neck:  Supple; no masses or thyromegaly. Lungs:  Respirations even and unlabored.  Clear throughout to auscultation.   No wheezes, crackles, or rhonchi. No acute distress. Heart:  Regular rate and rhythm; no murmurs, clicks, rubs, or gallops. Abdomen:  Normal bowel sounds. Soft,  non-tender and mildly distended, medium sized ventral hernia, reducible, nontender without masses, hepatosplenomegaly noted.  No guarding or rebound tenderness.   Rectal: Not performed Msk:  Symmetrical without gross deformities. Good, equal movement & strength bilaterally. Pulses:  Normal pulses noted. Extremities:  clubbing, 3+ edema.  No cyanosis. Neurologic:  Alert and oriented x3;  grossly normal neurologically. Skin:  Intact without significant lesions or rashes. No jaundice. Psych:  Alert and cooperative. Normal mood and affect.  Imaging Studies: Reviewed  Assessment and Plan:   Kirk Sampley is a 80 y.o. Caucasian female with history of metabolic syndrome, history of cervical cancer s/p hysterectomy and radiation treatment, decompensated [name] cirrhosis from variceal bleed s/p ligation in 2007, portal hypertensive gastropathy, gave s/p RFA, history of portal colopathy, radiation proctocolitis, is admitted with recurrent hematochezia and acute on chronic blood loss anemia  Hematochezia and acute blood loss anemia Overall, patient has a complicated history of recurrent bleeding requiring several blood transfusions within last few months despite undergoing endoscopic intervention.  Patient is not a liver transplant candidate Likely recurrent bleeding from radiation proctocolitis and/or portal colopathy After discussing with patient's daughter and patient, she wants everything to be done at this time Therefore, recommend flexible sigmoidoscopy to attempt APC, recommend tapwater enema and clear liquids I have discussed in length with patient's daughter regarding palliative care consultation given her overall decline within last few weeks.  Patient's daughter will discuss with her sister who is also partial power of attorney regarding discussion of end-of-life/goals of care Monitor CBC closely, maintain hemoglobin above 7 and platelets above 50  History of portal hypertensive gastropathy,  GAVE Recommend EGD along with flexible sigmoidoscopy Clear liquid diet today N.p.o. effective 5 AM    Cephas Darby, MD

## 2021-03-02 NOTE — ED Notes (Signed)
MD notified of pts BP/temp and in room to discuss plan of care with daughter of pt at bedside.

## 2021-03-02 NOTE — ED Notes (Signed)
Pt sleeping, resting comfortably in bed, NAD, chest rise & fall. No needs identified at this time. Bed low & locked; call light & Personal items within reach.

## 2021-03-02 NOTE — H&P (Signed)
History and Physical    April Blake ONG:[id number] DOB: 07-May-1940 DOA: 03/01/2021  PCP: Olin Hauser, DO   Patient coming from: home  I have personally briefly reviewed patient's relevant medical records in [place]  Chief Complaint: weakness, slurred speech  HPI: April Blake is a 80 y.o. 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  She has had no vomiting, melena, black or bloody stool Patient was hospitalized twice in the past 3 months with hematochezia.  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  Last endoscopy was in September when she underwent radiofrequency ablation for GAVE. History is limited due to altered mental status and is taken mostly from ED records.  Family not currently at bedside.  ED course: BP as low as 87/51 and fluid responsive, hypothermic at 95.1 Blood work with hemoglobin of 5, sodium 129, creatinine 1.74, troponin 4-4 and BNP 209  EKG, personally viewed and interpreted: Sinus at 65 with no acute ST-T wave changes  CT head with no acute intracranial abnormality MRI brain with no acute intracranial process Chest x-ray small left pleural effusion and left lower lobe atelectasis or infiltrate  Patient given IV fluid bolus started on a blood transfusion.  On Bair hugger for hypothermia.  Hospitalist consulted for admission.   Review of Systems: Unable to obtain due to lethargy Assessment/Plan    Acute on chronic blood loss anemia   Presumed GI bleeding secondary to GAVE(gastric antral vascular ectasia)   History of radiation colitis     History of esophageal varices -No overt blood loss - Hemoglobin 5 - Transfuse to hemoglobin over 7 and monitor thereafter - Close hemodynamic monitoring - Keep n.p.o. - GI consult  Acute metabolic encephalopathy Slurred speech - CT head and MRI negative for acute CVA - Secondary to symptomatic anemia - N.p.o. -Fall and aspiration precautions  Hypothermia Hypotension -Secondary to symptomatic anemia - Continue Bair hugger - Continue transfusions, IV hydration    Chronic kidney disease, stage 3b (HCC) - Renal function at baseline    Liver cirrhosis secondary to NASH (HCC) -Continue propranolol    Type 2 diabetes with nephropathy (HCC) - Sliding scale insulin coverage    Hypothyroidism - Continue levothyroxine    Uterine cancer (HCC) - Followed locally by Dr. Grayland Ormond   DVT prophylaxis: SCDs Code Status: full code  Family Communication:  none  Disposition Plan: Back to previous home environment Consults called: GI Status:At the time of admission, it appears that the appropriate admission status for this patient is INPATIENT. This is judged to be reasonable and necessary in order to provide the required intensity of service to ensure the patient's safety given the presenting symptoms, physical exam findings, and initial radiographic and laboratory data in the context of their  Comorbid conditions.   Patient requires inpatient status due to high intensity of service, high risk for further deterioration and high frequency of surveillance required.   I certify that at the point of admission it is my clinical judgment that the patient will require inpatient hospital care spanning beyond 2 midnights     Physical Exam: Vitals:   03/02/21 4401 03/02/21 0210 03/02/21 0215 03/02/21 0272  BP: (!) 96/43 (!) 98/45 (!) 98/50 (!) 96/48  Pulse: 65 65 65 66  Resp: 17 18 16 16   Temp:      TempSrc:      SpO2: 96% 95% 96% 95%  Weight:      Height:       Constitutional:  Lethargic, arousable but readily falls back asleep. Not in any apparent distress HEENT:      Head: Normocephalic and atraumatic.         Eyes: PERLA, EOMI, Conjunctivae are normal. Sclera is non-icteric.       Mouth/Throat: Mucous membranes are pale      Neck: Supple with no signs of meningismus. Cardiovascular: Regular rate and rhythm. No murmurs, gallops, or rubs. 2+ symmetrical distal pulses are present . No JVD. No  LE edema Respiratory: Respiratory effort normal .Lungs sounds clear bilaterally. No wheezes, crackles, or rhonchi.  Gastrointestinal: Soft, non tender, non distended. Positive bowel sounds.  Genitourinary: No CVA tenderness. Musculoskeletal: Nontender with normal range of motion in all extremities. No cyanosis, or erythema of extremities. Neurologic:  Face is symmetric. Moving all extremities. No gross focal neurologic deficits . Skin: Skin is warm, dry.  No rash or ulcers Psychiatric: Unable to assess due to lethargy and altered mentation acute metabolic encephalopathy    Past Medical History:  Diagnosis Date   Anemia    Heart disease    Hyperlipidemia    Hypertension    Liver disease    Thyroid disease    Uterine cancer ([place])     Past Surgical History:  Procedure Laterality Date   CATARACT EXTRACTION     cataract surg     SKIN CANCER EXCISION     skin cancer removal     surgery to left leg       reports that she has quit smoking. Her smoking use included cigarettes. She has a 1.00 pack-year smoking history. She has never used smokeless tobacco. She reports that she does not drink alcohol and does not use drugs.  No Known Allergies  No family history on file.  Unable to review family history due to altered mental status   Prior to Admission medications   Medication Sig Start Date End Date Taking? Authorizing Provider  ACCU-CHEK GUIDE test strip 1 each 4 (four) times daily. 05/19/20   [provider]  acetaminophen (TYLENOL) 500 MG tablet Take by  mouth.    [provider]  albuterol (VENTOLIN HFA) 108 (90 Base) MCG/ACT inhaler  09/22/17   [provider]  bisacodyl (DULCOLAX) 5 MG EC tablet Take by mouth.    [provider]  Budeson-Glycopyrrol-Formoterol (BREZTRI AEROSPHERE) 160-9-4.8 MCG/ACT AERO 2 puffs 04/29/20   [provider]  Coenzyme Q10 (CO Q-10) 200 MG CAPS 1 capsule with a meal    [provider]  Cysteamine Bitartrate (PROCYSBI) 300 MG PACK Use 1 each 3 (three) times daily Use as instructed. 03/28/20 03/28/21  [provider]  diclofenac Sodium (VOLTAREN) 1 % GEL Apply 2 g topically 4 (four) times daily as needed (osteoarthritis knee pain). 10/14/20   Karamalegos, Devonne Doughty, DO  ferrous sulfate 325 (65 FE) MG tablet 1 tablet    [provider]  furosemide (LASIX) 20 MG tablet Take 60 mg by mouth daily.    [provider]  glucose blood (ACCU-CHEK AVIVA PLUS) test strip  07/16/17   [provider]  glucose blood (ACCU-CHEK GUIDE) test strip Check BS 4/day -Fluctuating blood sugars 01/06/18  [provider]  insulin glargine (LANTUS SOLOSTAR) 100 UNIT/ML Solostar Pen 20 units in the morning 03/18/18   [provider]  insulin regular (NOVOLIN R) 100 units/mL injection See admin instructions. 06/27/17   [provider]  levothyroxine (SYNTHROID) 75 MCG tablet 1 tablet in the morning on an empty stomach    [provider]  omeprazole (PRILOSEC) 40 MG capsule Take 1 capsule (40 mg total) by mouth 2 (two) times daily before a meal. 01/15/21   Karamalegos, Devonne Doughty, DO  ondansetron (ZOFRAN) 4 MG tablet  04/02/20   [provider]  polyethylene glycol powder (GLYCOLAX/MIRALAX) 17 GM/SCOOP powder Take by mouth.    [provider]  predniSONE (DELTASONE) 10 MG tablet Take 6 tabs with breakfast Day 1, 5 tabs Day 2, 4 tabs Day 3, 3 tabs Day 4, 2 tabs Day 5, 1 tab Day 6. 02/09/21   Karamalegos, Devonne Doughty, DO   propranolol (INDERAL) 10 MG tablet 1 tablet on an empty stomach 08/15/17   [provider]  simvastatin (ZOCOR) 20 MG tablet Take 1 tablet (20 mg total) by mouth at bedtime. 10/14/20   Karamalegos, Devonne Doughty, DO  spironolactone (ALDACTONE) 50 MG tablet Take 100 mg by mouth daily. 07/03/20   [provider]      Labs on Admission: I have personally reviewed following labs and imaging studies  CBC: Recent Labs  Lab 03/01/21 1819  WBC 4.4  NEUTROABS 3.3  HGB 5.0*  HCT 17.1*  MCV 93.4  PLT 993   Basic Metabolic Panel: Recent Labs  Lab 03/01/21 1819  NA 129*  K 4.6  CL 97*  CO2 27  GLUCOSE 173*  BUN 37*  CREATININE 1.74*  CALCIUM 8.4*   GFR: Estimated Creatinine Clearance: 27 mL/min (A) (by C-G formula based on SCr of 1.74 mg/dL (H)). Liver Function Tests: Recent Labs  Lab 03/01/21 1819  AST 25  ALT 15  ALKPHOS 73  BILITOT 1.7*  PROT 5.8*  ALBUMIN 2.4*   No results for input(s): LIPASE, AMYLASE in the last 168 hours. No results for input(s): AMMONIA in the last 168 hours. Coagulation Profile: No results for input(s): INR, PROTIME in the last 168 hours. Cardiac Enzymes: No results for input(s): CKTOTAL, CKMB, CKMBINDEX, TROPONINI in the last 168 hours. BNP (last 3 results) No results for input(s): PROBNP in the last 8760 hours. HbA1C: No results for input(s): HGBA1C in the last 72 hours. CBG: No results for input(s): GLUCAP in the last 168 hours. Lipid Profile: No results for input(s): CHOL, HDL, LDLCALC, TRIG, CHOLHDL, LDLDIRECT in the last 72 hours. Thyroid Function Tests: No results for input(s): TSH, T4TOTAL, FREET4, T3FREE, THYROIDAB in the last 72 hours. Anemia Panel: No results for input(s): VITAMINB12, FOLATE, FERRITIN, TIBC, IRON, RETICCTPCT in the last 72 hours. Urine analysis: No results found for: COLORURINE, APPEARANCEUR, LABSPEC, [place], GLUCOSEU, HGBUR, BILIRUBINUR, KETONESUR, PROTEINUR, UROBILINOGEN, NITRITE,  LEUKOCYTESUR  Radiological Exams on Admission: DG Chest 2 View  Result Date: 03/01/2021 CLINICAL DATA:  Weakness. EXAM: CHEST - 2 VIEW COMPARISON:  None FINDINGS: Small left pleural effusion and left lower lobe atelectasis or infiltrate. The right lung is clear. No pneumothorax the cardiac silhouette is within limits. No acute osseous pathology. IMPRESSION: Small left pleural effusion and left lower lobe atelectasis or infiltrate. Electronically Signed   By: Anner Crete M.D.   On: 03/01/2021 19:53   CT Head Wo Contrast  Result Date: 03/01/2021 CLINICAL DATA:  Transient ischemic attack EXAM: CT HEAD WITHOUT  CONTRAST TECHNIQUE: Contiguous axial images were obtained from the base of the skull through the vertex without intravenous contrast. COMPARISON:  None. BRAIN: BRAIN Patchy and confluent areas of decreased attenuation are noted throughout the deep and periventricular white matter of the cerebral hemispheres bilaterally, compatible with chronic microvascular ischemic disease. No evidence of large-territorial acute infarction. No parenchymal hemorrhage. No mass lesion. No extra-axial collection. No mass effect or midline shift. No hydrocephalus. Basilar cisterns are patent. Vascular: No hyperdense vessel. Atherosclerotic calcifications are present within the cavernous internal carotid arteries. Skull: No acute fracture or focal lesion. Sinuses/Orbits: Left maxillary mucosal thickening. Otherwise paranasal sinuses and mastoid air cells are clear. Bilateral lens replacement. Otherwise orbits are unremarkable. Other: None. IMPRESSION: No acute intracranial abnormality. Electronically Signed   By: Iven Finn M.D.   On: 03/01/2021 19:24   MR BRAIN WO CONTRAST  Result Date: 03/01/2021 CLINICAL DATA:  Mental status change, unknown cause EXAM: MRI HEAD WITHOUT CONTRAST TECHNIQUE: Multiplanar, multiecho pulse sequences of the brain and surrounding structures were obtained without intravenous  contrast. COMPARISON:  No prior MRI, correlation is made with CT head 03/01/2021 FINDINGS: Evaluation is limited by motion artifact. Brain: No restricted diffusion to suggest acute infarction; diffusion-weighted sequences are not as motion limited. No definite acute hemorrhage, mass, mass effect, or midline shift. Confluent T2 hyperintense signal in the periventricular white matter, likely the sequela of severe chronic small vessel ischemic disease. Vascular: Normal flow voids. Skull and upper cervical spine: Unable to evaluate secondary to motion. Sinuses/Orbits: Left maxillary mucous retention cyst. Status post bilateral lens replacements. Other: The mastoids are well aerated. IMPRESSION: Evaluation is limited by motion, which affects nearly all sequences. Within this limitation, no acute intracranial process is seen. Electronically Signed   By: Merilyn Baba M.D.   On: 03/01/2021 23:57       Athena Masse MD [facility]   03/02/2021, 3:41 AM

## 2021-03-02 NOTE — Telephone Encounter (Signed)
Pt is in the ed being treated.

## 2021-03-02 NOTE — Progress Notes (Signed)
Patient seen and examined this morning, admitted overnight, H&P reviewed and agree with the assessment and plan  80 year old female with DM2, CKD 3B, hypothyroidism, endometrial cancer, [name] cirrhosis complicated by portal hypertensive gastropathy history of gave as well as variceal bleed, ongoing blood loss anemia requiring blood transfusions every 2 to 3 months comes into the ED with weakness, lightheadedness, slurred speech and a hemoglobin of 5.0.  Patient is awake this morning and does report to me that she has seen some bright red blood in her stools over the last day or 2.  Complains of ongoing weakness and fogginess  Acute on chronic blood loss anemia, presumed GI bleeding secondary to gave-GI consulted, to see.  She is receiving 2 units of packed red blood cells, repeat H&H, monitor and transfuse as necessary to keep hemoglobin above 7.  She also has a history of esophageal varices  Acute metabolic encephalopathy, slurred speech-imaging negative for CVA, suspect secondary to symptomatic anemia  Hypothermia, hypotension-improving, secondary to symptomatic anemia, monitor after transfusions  Liver cirrhosis secondary to NASH -complicated by GI bleeds, GI to see  Type II DM with neuropathy-SSI  Hypothyroidism-continue Synthroid  Uterine cancer-s/p radiation therapy, treatment complicated by radiation colitis and proctitis  Darrnell Mangiaracina M. Cruzita Lederer, MD, PhD [facility]  Between 7 am - 7 pm you can contact me via Amion (for emergencies) or [place] (non urgent matters).  I am not available 7 pm - 7 am, please contact night coverage MD/APP via Amion

## 2021-03-03 ENCOUNTER — Inpatient Hospital Stay: Payer: Medicare Other

## 2021-03-03 ENCOUNTER — Inpatient Hospital Stay: Payer: Medicare Other | Admitting: Anesthesiology

## 2021-03-03 ENCOUNTER — Encounter
Admission: EM | Disposition: A | Discharge status: Home Health | Payer: Self-pay | Source: Home / Self Care | Attending: Internal Medicine

## 2021-03-03 DIAGNOSIS — K627 Radiation proctitis: Secondary | ICD-10-CM

## 2021-03-03 DIAGNOSIS — K921 Melena: Secondary | ICD-10-CM | POA: Diagnosis not present

## 2021-03-03 DIAGNOSIS — N179 Acute kidney failure, unspecified: Secondary | ICD-10-CM | POA: Diagnosis not present

## 2021-03-03 DIAGNOSIS — K31819 Angiodysplasia of stomach and duodenum without bleeding: Secondary | ICD-10-CM | POA: Diagnosis not present

## 2021-03-03 DIAGNOSIS — N189 Chronic kidney disease, unspecified: Secondary | ICD-10-CM

## 2021-03-03 DIAGNOSIS — N1832 Chronic kidney disease, stage 3b: Secondary | ICD-10-CM | POA: Diagnosis not present

## 2021-03-03 DIAGNOSIS — D649 Anemia, unspecified: Secondary | ICD-10-CM

## 2021-03-03 DIAGNOSIS — D62 Acute posthemorrhagic anemia: Secondary | ICD-10-CM | POA: Diagnosis not present

## 2021-03-03 HISTORY — PX: FLEXIBLE SIGMOIDOSCOPY: SHX5431

## 2021-03-03 HISTORY — PX: ESOPHAGOGASTRODUODENOSCOPY (EGD) WITH PROPOFOL: SHX5813

## 2021-03-03 LAB — BPAM RBC
Blood Product Expiration Date: 202211282359
Blood Product Expiration Date: 202212152359
Blood Product Expiration Date: 202212172359
ISSUE DATE / TIME: 202211280022
ISSUE DATE / TIME: 202211280502
ISSUE DATE / TIME: 202211281339
Unit Type and Rh: 6200
Unit Type and Rh: 6200
Unit Type and Rh: 9500

## 2021-03-03 LAB — BASIC METABOLIC PANEL
Anion gap: 6 (ref 5–15)
BUN: 30 mg/dL — ABNORMAL HIGH (ref 8–23)
CO2: 26 mmol/L (ref 22–32)
Calcium: 8 mg/dL — ABNORMAL LOW (ref 8.9–10.3)
Chloride: 100 mmol/L (ref 98–111)
Creatinine, Ser: 1.37 mg/dL — ABNORMAL HIGH (ref 0.44–1.00)
GFR, Estimated: 40 mL/min — ABNORMAL LOW (ref >60)
Glucose, Bld: 86 mg/dL (ref 70–99)
Potassium: 4.1 mmol/L (ref 3.5–5.1)
Sodium: 132 mmol/L — ABNORMAL LOW (ref 135–145)

## 2021-03-03 LAB — TYPE AND SCREEN
ABO/RH(D): A POS
Antibody Screen: NEGATIVE
Unit division: 0
Unit division: 0
Unit division: 0

## 2021-03-03 LAB — CBC
HCT: 23.8 % — ABNORMAL LOW (ref 36.0–46.0)
HCT: 25.1 % — ABNORMAL LOW (ref 36.0–46.0)
Hemoglobin: 7.8 g/dL — ABNORMAL LOW (ref 12.0–15.0)
Hemoglobin: 8.2 g/dL — ABNORMAL LOW (ref 12.0–15.0)
MCH: 28.6 pg (ref 26.0–34.0)
MCH: 28.9 pg (ref 26.0–34.0)
MCHC: 32.8 g/dL (ref 30.0–36.0)
MCHC: 32.7 g/dL (ref 30.0–36.0)
MCV: 87.2 fL (ref 80.0–100.0)
MCV: 88.4 fL (ref 80.0–100.0)
Platelets: 99 10*3/uL — ABNORMAL LOW (ref 150–400)
Platelets: 110 10*3/uL — ABNORMAL LOW (ref 150–400)
RBC: 2.73 MIL/uL — ABNORMAL LOW (ref 3.87–5.11)
RBC: 2.84 MIL/uL — ABNORMAL LOW (ref 3.87–5.11)
RDW: 18.1 % — ABNORMAL HIGH (ref 11.5–15.5)
RDW: 18.6 % — ABNORMAL HIGH (ref 11.5–15.5)
WBC: 2.1 10*3/uL — ABNORMAL LOW (ref 4.0–10.5)
WBC: 2.7 10*3/uL — ABNORMAL LOW (ref 4.0–10.5)
nRBC: 1 % — ABNORMAL HIGH (ref 0.0–0.2)
nRBC: 0 % (ref 0.0–0.2)

## 2021-03-03 LAB — CORTISOL-AM, BLOOD: Cortisol - AM: 10.8 ug/dL (ref 6.7–22.6)

## 2021-03-03 LAB — GLUCOSE, CAPILLARY
Glucose-Capillary: 81 mg/dL (ref 70–99)
Glucose-Capillary: 85 mg/dL (ref 70–99)
Glucose-Capillary: 98 mg/dL (ref 70–99)

## 2021-03-03 LAB — PROCALCITONIN: Procalcitonin: 0.1 ng/mL

## 2021-03-03 LAB — TSH: TSH: 5.511 u[IU]/mL — ABNORMAL HIGH (ref 0.350–4.500)

## 2021-03-03 SURGERY — ESOPHAGOGASTRODUODENOSCOPY (EGD) WITH PROPOFOL
Anesthesia: General

## 2021-03-03 MED ORDER — PROPOFOL 500 MG/50ML IV EMUL
INTRAVENOUS | Status: DC | PRN
  Administered 2021-03-03: 25 ug/kg/min via INTRAVENOUS

## 2021-03-03 MED ORDER — SODIUM CHLORIDE 0.9 % IV SOLN
1.0000 g | INTRAVENOUS | Status: DC
  Administered 2021-03-03 – 2021-03-05 (×3): 1 g via INTRAVENOUS
  Filled 2021-03-03 (×2): qty 10
  Filled 2021-03-03: qty 1
  Filled 2021-03-03: qty 10

## 2021-03-03 MED ORDER — PROPRANOLOL HCL 20 MG PO TABS
10.0000 mg | ORAL_TABLET | Freq: Two times a day (BID) | ORAL | Status: DC
  Administered 2021-03-04 – 2021-03-05 (×2): 10 mg via ORAL
  Filled 2021-03-03 (×3): qty 1

## 2021-03-03 MED ORDER — PROPOFOL 10 MG/ML IV BOLUS
INTRAVENOUS | Status: DC | PRN
  Administered 2021-03-03: 50 mg via INTRAVENOUS

## 2021-03-03 MED ORDER — INSULIN ASPART 100 UNIT/ML IJ SOLN
0.0000 [IU] | Freq: Three times a day (TID) | INTRAMUSCULAR | Status: DC
  Administered 2021-03-04: 1 [IU] via SUBCUTANEOUS
  Administered 2021-03-04: 3 [IU] via SUBCUTANEOUS
  Administered 2021-03-04 – 2021-03-05 (×4): 2 [IU] via SUBCUTANEOUS
  Filled 2021-03-03 (×6): qty 1

## 2021-03-03 MED ORDER — LIDOCAINE HCL (PF) 2 % IJ SOLN
INTRAMUSCULAR | Status: DC | PRN
  Administered 2021-03-03: 100 mg via INTRADERMAL

## 2021-03-03 MED ORDER — PHENYLEPHRINE HCL-NACL 20-0.9 MG/250ML-% IV SOLN
INTRAVENOUS | Status: AC
  Filled 2021-03-03: qty 250

## 2021-03-03 MED ORDER — PHENYLEPHRINE HCL (PRESSORS) 10 MG/ML IV SOLN
INTRAVENOUS | Status: AC
  Filled 2021-03-03: qty 1

## 2021-03-03 MED ORDER — LEVOTHYROXINE SODIUM 25 MCG PO TABS
75.0000 ug | ORAL_TABLET | Freq: Every day | ORAL | Status: DC
  Administered 2021-03-04 – 2021-03-05 (×2): 75 ug via ORAL
  Filled 2021-03-03: qty 2
  Filled 2021-03-03: qty 1

## 2021-03-03 MED ORDER — PANTOPRAZOLE SODIUM 40 MG IV SOLR
40.0000 mg | Freq: Two times a day (BID) | INTRAVENOUS | Status: DC
  Administered 2021-03-03 – 2021-03-05 (×5): 40 mg via INTRAVENOUS
  Filled 2021-03-03 (×4): qty 40

## 2021-03-03 MED ORDER — SODIUM CHLORIDE 0.9 % IV SOLN: INTRAVENOUS | Status: DC | PRN

## 2021-03-03 MED ORDER — FERROUS GLUCONATE 324 (38 FE) MG PO TABS
324.0000 mg | ORAL_TABLET | Freq: Three times a day (TID) | ORAL | Status: DC
  Administered 2021-03-03 – 2021-03-05 (×7): 324 mg via ORAL
  Filled 2021-03-03 (×11): qty 1

## 2021-03-03 MED ORDER — PANTOPRAZOLE SODIUM 40 MG IV SOLR
INTRAVENOUS | Status: AC
  Filled 2021-03-03: qty 40

## 2021-03-03 MED ORDER — ALBUTEROL SULFATE (2.5 MG/3ML) 0.083% IN NEBU
2.5000 mg | INHALATION_SOLUTION | Freq: Four times a day (QID) | RESPIRATORY_TRACT | Status: DC | PRN
  Administered 2021-03-05: 2.5 mg via RESPIRATORY_TRACT
  Filled 2021-03-03 (×2): qty 3

## 2021-03-03 MED ORDER — PHENYLEPHRINE HCL (PRESSORS) 10 MG/ML IV SOLN
INTRAVENOUS | Status: DC | PRN
  Administered 2021-03-03: 80 ug via INTRAVENOUS

## 2021-03-03 NOTE — Op Note (Signed)
[facility] Gastroenterology Patient Name: April Blake Procedure Date: 03/03/2021 1:17 PM MRN: [id number] Account #: [id number] Date of Birth: [birth year] Admit Type: Inpatient Age: 80 Room: [facility] ENDO ROOM 3 Gender: Female Note Status: Finalized Instrument Name: Upper Endoscope [id number] Procedure:             Upper GI endoscopy Indications:           Acute post hemorrhagic anemia, Hematochezia Providers:             Lin Landsman MD, MD Referring MD:          No Local Md, MD (Referring MD) Medicines:             General Anesthesia Complications:         No immediate complications. Estimated blood loss: None. Procedure:             Pre-Anesthesia Assessment:                        - Prior to the procedure, a History and Physical was                         performed, and patient medications and allergies were                         reviewed. The patient is competent. The risks and                         benefits of the procedure and the sedation options and                         risks were discussed with the patient. All questions                         were answered and informed consent was obtained.                         Patient identification and proposed procedure were                         verified by the physician, the nurse, the                         anesthesiologist, the anesthetist and the technician                         in the pre-procedure area in the procedure room in the                         endoscopy suite. Mental Status Examination: alert and                         oriented. Airway Examination: normal oropharyngeal                         airway and neck mobility. Respiratory Examination:                         clear to auscultation. CV Examination: normal.  Prophylactic Antibiotics: The patient does not require                         prophylactic antibiotics. Prior Anticoagulants: The                          patient has taken no previous anticoagulant or                         antiplatelet agents. ASA Grade Assessment: IV - A                         patient with severe systemic disease that is a                         constant threat to life. After reviewing the risks and                         benefits, the patient was deemed in satisfactory                         condition to undergo the procedure. The anesthesia                         plan was to use general anesthesia. Immediately prior                         to administration of medications, the patient was                         re-assessed for adequacy to receive sedatives. The                         heart rate, respiratory rate, oxygen saturations,                         blood pressure, adequacy of pulmonary ventilation, and                         response to care were monitored throughout the                         procedure. The physical status of the patient was                         re-assessed after the procedure.                        After obtaining informed consent, the endoscope was                         passed under direct vision. Throughout the procedure,                         the patient's blood pressure, pulse, and oxygen                         saturations were monitored continuously. The Endoscope  was introduced through the mouth, and advanced to the                         second part of duodenum. The upper GI endoscopy was                         accomplished without difficulty. The patient tolerated                         the procedure well. Findings:      The duodenal bulb and second portion of the duodenum were normal.      Moderate, diffuse gastric antral vascular ectasia with bleeding was       present in the gastric antrum and in the prepyloric region of the       stomach. Coagulation for hemostasis using argon plasma was successful.       Estimated blood loss:  none.      Severe, diffuse portal hypertensive gastropathy was found in the entire       examined stomach.      Three columns of small (< 5 mm) varices with no bleeding and no stigmata       of recent bleeding were found in the lower third of the esophagus, 35 cm       from the incisors. Red wale signs were present. Scarring from prior       treatment was visible. Evidence of partial eradication was visible. The       varices appeared smaller than they were at prior exam. Impression:            - Normal duodenal bulb and second portion of the                         duodenum.                        - Gastric antral vascular ectasia with bleeding.                         Treated with argon plasma coagulation (APC).                        - Portal hypertensive gastropathy.                        - Small (< 5 mm) esophageal varices with no bleeding                         and no stigmata of recent bleeding.                        - No specimens collected. Recommendation:        - Proceed with sigmoidoscopy as scheduled                        See colonoscopy report Procedure Code(s):     --- Professional ---                        14431, Esophagogastroduodenoscopy, flexible,  transoral; with control of bleeding, any method Diagnosis Code(s):     --- Professional ---                        K31.811, Angiodysplasia of stomach and duodenum with                         bleeding                        K76.6, Portal hypertension                        K31.89, Other diseases of stomach and duodenum                        I85.00, Esophageal varices without bleeding                        D62, Acute posthemorrhagic anemia                        K92.1, Melena (includes Hematochezia) CPT copyright 2019 [employer] Medical Association. All rights reserved. The codes documented in this report are preliminary and upon coder review may  be revised to meet current compliance  requirements. Dr. Ulyess Mort Lin Landsman MD, MD 03/03/2021 1:37:44 PM This report has been signed electronically. Number of Addenda: 0 Note Initiated On: 03/03/2021 1:17 PM Estimated Blood Loss:  Estimated blood loss: none.      [facility]

## 2021-03-03 NOTE — Op Note (Signed)
[facility] Patient Name: April Blake Procedure Date: 03/03/2021 1:17 PM MRN: [id number] Account #: [id number] Date of Birth: June 16, 1940 Admit Type: Inpatient Age: 80 Room: [facility] ENDO ROOM 3 Gender: Female Note Status: Finalized Instrument Name: Upper Endoscope [id number] Procedure:             Flexible Sigmoidoscopy Indications:           Hematochezia Providers:             Lin Landsman MD, MD Referring MD:          No Local Md, MD (Referring MD) Medicines:             General Anesthesia Complications:         No immediate complications. Estimated blood loss: None. Procedure:             Pre-Anesthesia Assessment:                        - Prior to the procedure, a History and Physical was                         performed, and patient medications and allergies were                         reviewed. The patient is competent. The risks and                         benefits of the procedure and the sedation options and                         risks were discussed with the patient. All questions                         were answered and informed consent was obtained.                         Patient identification and proposed procedure were                         verified by the physician, the nurse, the                         anesthesiologist, the anesthetist and the technician                         in the pre-procedure area in the procedure room in the                         endoscopy suite. Mental Status Examination: alert and                         oriented. Airway Examination: normal oropharyngeal                         airway and neck mobility. Respiratory Examination:                         clear to auscultation. CV Examination: normal.  Prophylactic Antibiotics: The patient does not require                         prophylactic antibiotics. Prior Anticoagulants: The                         patient has taken no  previous anticoagulant or                         antiplatelet agents. ASA Grade Assessment: IV - A                         patient with severe systemic disease that is a                         constant threat to life. After reviewing the risks and                         benefits, the patient was deemed in satisfactory                         condition to undergo the procedure. The anesthesia                         plan was to use general anesthesia. Immediately prior                         to administration of medications, the patient was                         re-assessed for adequacy to receive sedatives. The                         heart rate, respiratory rate, oxygen saturations,                         blood pressure, adequacy of pulmonary ventilation, and                         response to care were monitored throughout the                         procedure. The physical status of the patient was                         re-assessed after the procedure.                        After obtaining informed consent, the scope was passed                         under direct vision. The Endoscope was introduced                         through the anus and advanced to the the sigmoid                         colon. The flexible sigmoidoscopy was accomplished  without difficulty. The patient tolerated the                         procedure fairly well. The quality of the bowel                         preparation was fair. Findings:      The perianal and digital rectal examinations were normal.      Significantly congested mucosa with old blood and clots mixed with       stool, scatterred areas of oozing was found in the entire rectum, in the       recto-sigmoid colon and in the sigmoid colon consistent with patient's       prior h/o of portal colopathy and radiation proctocolitis. Hemostasis       was not perfomed given the extent and severity of colopathy/proctitis        and due to presence of stool mixed with blood Impression:            - Preparation of the colon was fair.                        - Congested mucosa in the rectum, in the recto-sigmoid                         colon and in the sigmoid colon.                        - No specimens collected. Recommendation:        - Return patient to hospital ward for ongoing care.                        - Advance diet as tolerated today.                        - Recommend palliative care consult to discuss goals                         of care because of lack of treatment options other                         than liver transplant and patient is not a candidate                         for it unfortunately Procedure Code(s):     --- Professional ---                        [id number], Sigmoidoscopy, flexible; diagnostic, including                         collection of specimen(s) by brushing or washing, when                         performed (separate procedure) Diagnosis Code(s):     --- Professional ---                        K62.89, Other specified diseases of anus and rectum  K63.89, Other specified diseases of intestine                        K92.1, Melena (includes Hematochezia) CPT copyright 2019 American Medical Association. All rights reserved. The codes documented in this report are preliminary and upon coder review may  be revised to meet current compliance requirements. Dr. Ulyess Mort Lin Landsman MD, MD 03/03/2021 1:52:25 PM This report has been signed electronically. Number of Addenda: 0 Note Initiated On: 03/03/2021 1:17 PM Total Procedure Duration: 0 hours 5 minutes 10 seconds  Estimated Blood Loss:  Estimated blood loss: none.      [facility]

## 2021-03-03 NOTE — Anesthesia Preprocedure Evaluation (Addendum)
Anesthesia Evaluation  Patient identified by MRN, date of birth, ID band Patient awake    Reviewed: Allergy & Precautions, NPO status , Patient's Chart, lab work & pertinent test results  History of Anesthesia Complications Negative for: history of anesthetic complications  Airway Mallampati: III   Neck ROM: Full    Dental   Many missing teeth:   Pulmonary former smoker (quit 20 years ago),    Pulmonary exam normal breath sounds clear to auscultation       Cardiovascular hypertension, Normal cardiovascular exam Rhythm:Regular Rate:Normal  ECG 03/01/21:  Junctional rhythm Low voltage QRS Nonspecific T wave abnormality   Neuro/Psych  Neuromuscular disease (diabetic peripheral neuropathy)    GI/Hepatic (+) Cirrhosis  (NASH)      , Acute-on-chronic GI bleed; hx GAVE and variceal bleed   Endo/Other  diabetes, Type 2Hypothyroidism Obesity   Renal/GU Renal disease (stage III CKD)     Musculoskeletal   Abdominal   Peds  Hematology  (+) Blood dyscrasia, anemia ,   Anesthesia Other Findings   Reproductive/Obstetrics Uterine CA                            Anesthesia Physical Anesthesia Plan  ASA: 4  Anesthesia Plan: General   Post-op Pain Management:    Induction: Intravenous  PONV Risk Score and Plan: 3 and Propofol infusion, TIVA and Treatment may vary due to age or medical condition  Airway Management Planned: Natural Airway  Additional Equipment:   Intra-op Plan:   Post-operative Plan:   Informed Consent: I have reviewed the patients History and Physical, chart, labs and discussed the procedure including the risks, benefits and alternatives for the proposed anesthesia with the patient or authorized representative who has indicated his/her understanding and acceptance.       Plan Discussed with: CRNA  Anesthesia Plan Comments: (LMA/GETA backup discussed.  Patient consented  for risks of anesthesia including but not limited to:  - adverse reactions to medications - damage to eyes, teeth, lips or other oral mucosa - nerve damage due to positioning  - sore throat or hoarseness - damage to heart, brain, nerves, lungs, other parts of body or loss of life  Informed patient about role of CRNA in peri- and intra-operative care.  Patient voiced understanding.)        Anesthesia Quick Evaluation

## 2021-03-03 NOTE — Transfer of Care (Signed)
Immediate Anesthesia Transfer of Care Note  [name]  Procedure(s) Performed: ESOPHAGOGASTRODUODENOSCOPY (EGD) WITH PROPOFOL FLEXIBLE SIGMOIDOSCOPY  Patient Location: PACU  Anesthesia Type:General  Level of Consciousness: drowsy  Airway & Oxygen Therapy: Patient Spontanous Breathing  Post-op Assessment: Report given to RN  Post vital signs: stable  Last Vitals:  Vitals Value Taken Time  BP    Temp    Pulse    Resp    SpO2      Last Pain:  Vitals:   03/03/21 1200  TempSrc:   PainSc: 0-No pain         Complications: No notable events documented.

## 2021-03-03 NOTE — Progress Notes (Signed)
PROGRESS NOTE  April Blake YKD:[id number] DOB: 1940-10-16 DOA: 03/01/2021 PCP: Olin Hauser, DO   LOS: 1 day   Brief Narrative / Interim history: 80 year old female with DM2, CKD 3B, hypothyroidism, endometrial cancer, [name] cirrhosis complicated by portal hypertensive gastropathy history of gave as well as variceal bleed, ongoing blood loss anemia requiring blood transfusions every 2 to 3 months comes into the ED with weakness, lightheadedness, slurred speech and a hemoglobin of 5.0.  Subjective / 24h Interval events: Doing well this morning, denies any shortness of breath, no abdominal pain, no nausea or vomiting.  She is surprised that her temperature is low today.  No chest pain.  Assessment & Plan: Principal Problem:   Acute on chronic blood loss anemia Active Problems:   Chronic kidney disease, stage 3b (HCC)   Liver cirrhosis secondary to NASH (HCC)   GAVE (gastric antral vascular ectasia)   Type 2 diabetes with nephropathy (HCC)   Hypothyroidism   Uterine cancer (HCC)   Symptomatic anemia  Principal Problem Symptomatic anemia due to acute on chronic GI bleed -GI consulted, evaluated patient planning for EGD and flexible sigmoidoscopy hopefully today.  She has received a total of 3 unit of packed red blood cells for hemoglobin of 5.7 on admission and hemoglobin this morning 7.8, does not need transfusion currently, monitor hemoglobin and keep above 7. -She has a history of GAVE as well as remote history of esophageal varices -Continue ceftriaxone, Protonix.  Continue propranolol  Active Problems Acute metabolic encephalopathy, slurred speech-imaging negative for CVA, suspect secondary to symptomatic anemia.  She seems clear this morning, do not appreciate slurred speech  Hypothermia, hypotension-probably due to symptomatic anemia, her blood pressure still on the soft side and she still hypothermic.  With GI bleeding known cirrhosis I will add ceftriaxone -Check  TSH, cortisol for completeness.  She most recently had a course of prednisone 4 weeks ago post COVID chronic cough.  Continue Bair hugger  Liver cirrhosis secondary to NASH -complicated by GI bleeds, GI consulted and following  Hyponatremia-likely in the setting of fluid overload/liver cirrhosis.  Stable, monitor.  Holding Lasix and spironolactone due to soft blood pressure currently  Chronic kidney disease stage IIIa-Baseline creatinine 1.2-1.5, creatinine was as high as 1.7 yesterday, back to baseline currently at 1.2   Type II DM with neuropathy-monitor CBGs, she is on Lantus and NovoLog at home.  Continue sliding scale   Hypothyroidism-continue Synthroid   Uterine cancer-s/p radiation therapy, treatment complicated by radiation colitis and proctitis  Scheduled Meds:  Chlorhexidine Gluconate Cloth  6 each Topical Q0600   ferrous gluconate  324 mg Oral TID WC   insulin aspart  0-9 Units Subcutaneous TID WC   [START ON 03/04/2021] levothyroxine  75 mcg Oral Q0600   pantoprazole (PROTONIX) IV  40 mg Intravenous Q12H   propranolol  10 mg Oral BID   Continuous Infusions:  sodium chloride     cefTRIAXone (ROCEPHIN)  IV     PRN Meds:.  Diet Orders (From admission, onward)     Start     Ordered   03/02/21 1334  Diet clear liquid Room service appropriate? Yes; Fluid consistency: Thin  Diet effective now       Question Answer Comment  Room service appropriate? Yes   Fluid consistency: Thin      03/02/21 1333            DVT prophylaxis: Place and maintain sequential compression device Start: 03/02/21 0757     Code Status:  Not on file  Family Communication: No family at bedside  Status is: Inpatient  Remains inpatient appropriate because: Severity of illness  Level of care: Stepdown  Consultants:  Gastroenterology  Procedures:  None   Microbiology  None   Antimicrobials: Ceftriaxone 11/29 >>    Objective: Vitals:   03/03/21 0700 03/03/21 0800 03/03/21  0817 03/03/21 0900  BP: (!) 98/45 (!) 105/49  (!) 98/39  Pulse: 75 74 81 80  Resp: 12 16 (!) 22 (!) 22  Temp:   97.7 F (36.5 C)   TempSrc:   Oral   SpO2: 95% 94% 95% 96%  Weight:      Height:        Intake/Output Summary (Last 24 hours) at 03/03/2021 0946 Last data filed at 03/03/2021 0800 Gross per 24 hour  Intake 1214.73 ml  Output 1000 ml  Net 214.73 ml   Filed Weights   03/01/21 1817 03/02/21 2145  Weight: 81.6 kg 89.9 kg    Examination:  Constitutional: NAD, pale appearing Eyes: no scleral icterus ENMT: Mucous membranes are moist.  Neck: normal, supple Respiratory: clear to auscultation bilaterally, no wheezing, no crackles. Normal respiratory effort.  Diminished at the bases Cardiovascular: Regular rate and rhythm, no murmurs / rubs / gallops.  2+ pitting edema Abdomen: non distended, no tenderness. Bowel sounds positive.  Musculoskeletal: no clubbing / cyanosis.  Skin: no rashes Neurologic: Nonfocal  Data Reviewed: I have independently reviewed following labs and imaging studies   CBC: Recent Labs  Lab 03/01/21 1819 03/02/21 1000 03/02/21 1224 03/02/21 1721 03/03/21 0247  WBC 4.4 2.0* 2.2*  --  2.1*  NEUTROABS 3.3  --  1.6*  --   --   HGB 5.0* 6.4* 6.6* 7.3* 7.8*  HCT 17.1* 20.4* 21.3* 23.1* 23.8*  MCV 93.4 88.3 87.7  --  87.2  PLT 170 98* 103*  --  99*   Basic Metabolic Panel: Recent Labs  Lab 03/01/21 1819 03/03/21 0247  NA 129* 132*  K 4.6 4.1  CL 97* 100  CO2 27 26  GLUCOSE 173* 86  BUN 37* 30*  CREATININE 1.74* 1.37*  CALCIUM 8.4* 8.0*   Liver Function Tests: Recent Labs  Lab 03/01/21 1819  AST 25  ALT 15  ALKPHOS 73  BILITOT 1.7*  PROT 5.8*  ALBUMIN 2.4*   Coagulation Profile: No results for input(s): INR, PROTIME in the last 168 hours. HbA1C: No results for input(s): HGBA1C in the last 72 hours. CBG: No results for input(s): GLUCAP in the last 168 hours.  Recent Results (from the past 240 hour(s))  Resp Panel by  RT-PCR (Flu A&B, Covid) Nasopharyngeal Swab     Status: None   Collection Time: 03/01/21  6:16 PM   Specimen: Nasopharyngeal Swab; Nasopharyngeal(NP) swabs in vial transport medium  Result Value Ref Range Status   SARS Coronavirus 2 by RT PCR NEGATIVE NEGATIVE Final    Comment: (NOTE) SARS-CoV-2 target nucleic acids are NOT DETECTED.  The SARS-CoV-2 RNA is generally detectable in upper respiratory specimens during the acute phase of infection. The lowest concentration of SARS-CoV-2 viral copies this assay can detect is 138 copies/mL. A negative result does not preclude SARS-Cov-2 infection and should not be used as the sole basis for treatment or other patient management decisions. A negative result may occur with  improper specimen collection/handling, submission of specimen other than nasopharyngeal swab, presence of viral mutation(s) within the areas targeted by this assay, and inadequate number of viral copies(<138 copies/mL). A negative  result must be combined with clinical observations, patient history, and epidemiological information. The expected result is Negative.  Fact Sheet for Patients:  EntrepreneurPulse.com.au  Fact Sheet for Healthcare Providers:  IncredibleEmployment.be  This test is no t yet approved or cleared by the [place] FDA and  has been authorized for detection and/or diagnosis of SARS-CoV-2 by FDA under an Emergency Use Authorization (EUA). This EUA will remain  in effect (meaning this test can be used) for the duration of the COVID-19 declaration under Section 564(b)(1) of the Act, 21 U.S.C.section 360bbb-3(b)(1), unless the authorization is terminated  or revoked sooner.       Influenza A by PCR NEGATIVE NEGATIVE Final   Influenza B by PCR NEGATIVE NEGATIVE Final    Comment: (NOTE) The Xpert Xpress SARS-CoV-2/FLU/RSV plus assay is intended as an aid in the diagnosis of influenza from Nasopharyngeal swab  specimens and should not be used as a sole basis for treatment. Nasal washings and aspirates are unacceptable for Xpert Xpress SARS-CoV-2/FLU/RSV testing.  Fact Sheet for Patients: EntrepreneurPulse.com.au  Fact Sheet for Healthcare Providers: IncredibleEmployment.be  This test is not yet approved or cleared by the [place] FDA and has been authorized for detection and/or diagnosis of SARS-CoV-2 by FDA under an Emergency Use Authorization (EUA). This EUA will remain in effect (meaning this test can be used) for the duration of the COVID-19 declaration under Section 564(b)(1) of the Act, 21 U.S.C. section 360bbb-3(b)(1), unless the authorization is terminated or revoked.  Performed at [facility], [address] [place], [place] [address]   MRSA Next Gen by PCR, Nasal     Status: None   Collection Time: 03/02/21  9:43 PM   Specimen: Nasal Mucosa; Nasal Swab  Result Value Ref Range Status   MRSA by PCR Next Gen NOT DETECTED NOT DETECTED Final    Comment: (NOTE) The GeneXpert MRSA Assay (FDA approved for NASAL specimens only), is one component of a comprehensive MRSA colonization surveillance program. It is not intended to diagnose MRSA infection nor to guide or monitor treatment for MRSA infections. Test performance is not FDA approved in patients less than 88 years old. Performed at [facility], [address], [place], [place] [address]      Radiology Studies: No results found.   Marzetta Board, MD, PhD [facility]  Between 7 am - 7 pm I am available, please contact me via Amion (for emergencies) or Securechat (non urgent messages)  Between 7 pm - 7 am I am not available, please contact night coverage MD/APP via Amion

## 2021-03-03 NOTE — Anesthesia Postprocedure Evaluation (Signed)
Anesthesia Post Note  [name]  Procedure(s) Performed: ESOPHAGOGASTRODUODENOSCOPY (EGD) WITH PROPOFOL FLEXIBLE SIGMOIDOSCOPY  Patient location during evaluation: PACU Anesthesia Type: General Level of consciousness: awake and alert, oriented and patient cooperative Pain management: pain level controlled Vital Signs Assessment: post-procedure vital signs reviewed and stable Respiratory status: spontaneous breathing, nonlabored ventilation and respiratory function stable Cardiovascular status: blood pressure returned to baseline and stable Postop Assessment: adequate PO intake Anesthetic complications: no   No notable events documented.   Last Vitals:  Vitals:   03/03/21 1200 03/03/21 1353  BP: (!) 95/42 (!) 104/42  Pulse: 71 80  Resp: 12 17  Temp:    SpO2: 95% 97%    Last Pain:  Vitals:   03/03/21 1353  TempSrc:   PainSc: Asleep                 Darrin Nipper

## 2021-03-04 ENCOUNTER — Encounter: Payer: Self-pay | Admitting: Gastroenterology

## 2021-03-04 DIAGNOSIS — D62 Acute posthemorrhagic anemia: Secondary | ICD-10-CM | POA: Diagnosis not present

## 2021-03-04 LAB — COMPREHENSIVE METABOLIC PANEL
ALT: 13 U/L (ref 0–44)
AST: 29 U/L (ref 15–41)
Albumin: 2 g/dL — ABNORMAL LOW (ref 3.5–5.0)
Alkaline Phosphatase: 60 U/L (ref 38–126)
Anion gap: 3 — ABNORMAL LOW (ref 5–15)
BUN: 26 mg/dL — ABNORMAL HIGH (ref 8–23)
CO2: 25 mmol/L (ref 22–32)
Calcium: 7.9 mg/dL — ABNORMAL LOW (ref 8.9–10.3)
Chloride: 102 mmol/L (ref 98–111)
Creatinine, Ser: 1.35 mg/dL — ABNORMAL HIGH (ref 0.44–1.00)
GFR, Estimated: 40 mL/min — ABNORMAL LOW (ref >60)
Glucose, Bld: 127 mg/dL — ABNORMAL HIGH (ref 70–99)
Potassium: 4.6 mmol/L (ref 3.5–5.1)
Sodium: 130 mmol/L — ABNORMAL LOW (ref 135–145)
Total Bilirubin: 1.8 mg/dL — ABNORMAL HIGH (ref 0.3–1.2)
Total Protein: 5.1 g/dL — ABNORMAL LOW (ref 6.5–8.1)

## 2021-03-04 LAB — CBC
HCT: 25 % — ABNORMAL LOW (ref 36.0–46.0)
Hemoglobin: 8.1 g/dL — ABNORMAL LOW (ref 12.0–15.0)
MCH: 28.8 pg (ref 26.0–34.0)
MCHC: 32.4 g/dL (ref 30.0–36.0)
MCV: 89 fL (ref 80.0–100.0)
Platelets: 133 10*3/uL — ABNORMAL LOW (ref 150–400)
RBC: 2.81 MIL/uL — ABNORMAL LOW (ref 3.87–5.11)
RDW: 18.5 % — ABNORMAL HIGH (ref 11.5–15.5)
WBC: 4 10*3/uL (ref 4.0–10.5)
nRBC: 0.5 % — ABNORMAL HIGH (ref 0.0–0.2)

## 2021-03-04 LAB — HEMOGLOBIN A1C
Hgb A1c MFr Bld: 5.3 % (ref 4.8–5.6)
Mean Plasma Glucose: 105 mg/dL

## 2021-03-04 LAB — GLUCOSE, CAPILLARY
Glucose-Capillary: 124 mg/dL — ABNORMAL HIGH (ref 70–99)
Glucose-Capillary: 212 mg/dL — ABNORMAL HIGH (ref 70–99)
Glucose-Capillary: 185 mg/dL — ABNORMAL HIGH (ref 70–99)
Glucose-Capillary: 206 mg/dL — ABNORMAL HIGH (ref 70–99)

## 2021-03-04 LAB — MAGNESIUM: Magnesium: 2.2 mg/dL (ref 1.7–2.4)

## 2021-03-04 LAB — PROCALCITONIN: Procalcitonin: 0.24 ng/mL

## 2021-03-04 NOTE — Progress Notes (Signed)
PROGRESS NOTE    April Blake  TIR:[id number] DOB: Feb 06, 1941 DOA: 03/01/2021 PCP: Olin Hauser, DO   Brief Narrative: Taken from prior notes. 80 year old female with DM2, CKD 3B, hypothyroidism, endometrial cancer, [name] cirrhosis complicated by portal hypertensive gastropathy history of gave as well as variceal bleed, ongoing blood loss anemia requiring blood transfusions every 2 to 3 months comes into the ED with weakness, lightheadedness, slurred speech and a hemoglobin of 5.0.  Secondary to hematochezia.  Sigmoidoscopy with very friable colonic mucosa, consistent with portal gastropathy and postradiation proctitis.  Received 3 units of PRBC.  Subjective: Patient was seen and examined today.  Denies any complaints.  No recent bowel movement.  Patient normally takes stool softeners.  Denies any more hematochezia.  No abdominal pain, nausea or vomiting.  Daughter at bedside.  Assessment & Plan:   Principal Problem:   Acute on chronic blood loss anemia Active Problems:   Chronic kidney disease, stage 3b (HCC)   Liver cirrhosis secondary to NASH (HCC)   GAVE (gastric antral vascular ectasia)   Type 2 diabetes with nephropathy (HCC)   Hypothyroidism   Uterine cancer (HCC)   Symptomatic anemia  Symptomatic anemia secondary to acute on chronic GI bleed.  Patient received 3 units of PRBC for hemoglobin of 5.7 on admission.  Currently stable around 8.1. Patient underwent sigmoidoscopy with GI which were consistent with portal gastropathy and postradiation proctitis with very friable mucosa and diffuse disease.  No intervention was done.  They were recommending palliative care involvement, palliative care was consulted.. Patient also has an history of GAVE and esophageal varices. Ceftriaxone was added due to concern of GI bleed with liver cirrhosis, as there was some concern of hypothermia and hypotension. -Continue with Protonix -Continue with propranolol -Continue with  ceftriaxone for another day  Acute metabolic encephalopathy, slurred speech.  Resolved.  Imaging negative for any acute CVA.  Most likely secondary to acute bleeding.  Hypotension/hypothermia.  Resolved.  TSH and cortisol within normal limit.  Hyponatremia.  Mild hyponatremia with history of liver cirrhosis and uterine malignancy. -Continue to monitor  Liver cirrhosis secondary to NASH -complicated by GI bleeds, GI consulted and following.  CKD stage IIIa.  Baseline creatinine around 1.2-1.5.  Currently at baseline. -Continue to monitor -Avoid nephrotoxins  Type 2 diabetes mellitus with neuropathy. -Continue with SSI  Hypothyroidism. -Continue with home dose of Synthroid.  Uterine cancer-s/p radiation therapy, treatment complicated by radiation colitis and proctitis.  Objective: Vitals:   03/04/21 0900 03/04/21 0920 03/04/21 1000 03/04/21 1100  BP: (!) 98/55 (!) 98/55    Pulse: 85 83 83 83  Resp: (!) 22 (!) 21 14 20   Temp:      TempSrc:      SpO2: 96% 100% 98% 98%  Weight:      Height:        Intake/Output Summary (Last 24 hours) at 03/04/2021 1400 Last data filed at 03/04/2021 0220 Gross per 24 hour  Intake 240.73 ml  Output 400 ml  Net -159.27 ml   Filed Weights   03/01/21 1817 03/02/21 2145  Weight: 81.6 kg 89.9 kg    Examination:  General exam: Appears calm and comfortable  Respiratory system: Clear to auscultation. Respiratory effort normal. Cardiovascular system: S1 & S2 heard, RRR.  Gastrointestinal system: Soft, nontender, nondistended, bowel sounds positive. Central nervous system: Alert and oriented. No focal neurological deficits. Extremities: Trace LE edema, no cyanosis, pulses intact and symmetrical. Psychiatry: Judgement and insight appear normal. Mood & affect appropriate.  DVT prophylaxis: SCDs Code Status:  Family Communication: Daughter was updated at bedside Disposition Plan:  Status is: Inpatient  Remains inpatient appropriate  because: Severity of illness.   Level of care: Stepdown  All the records are reviewed and case discussed with Care Management/Social Worker. Management plans discussed with the patient, nursing and they are in agreement.  Consultants:  GI       Palliative care  Procedures:  Antimicrobials:  Ceftriaxone  Data Reviewed: I have personally reviewed following labs and imaging studies  CBC: Recent Labs  Lab 03/01/21 1819 03/02/21 1000 03/02/21 1224 03/02/21 1721 03/03/21 0247 03/03/21 1612 03/04/21 0508  WBC 4.4 2.0* 2.2*  --  2.1* 2.7* 4.0  NEUTROABS 3.3  --  1.6*  --   --   --   --   HGB 5.0* 6.4* 6.6* 7.3* 7.8* 8.2* 8.1*  HCT 17.1* 20.4* 21.3* 23.1* 23.8* 25.1* 25.0*  MCV 93.4 88.3 87.7  --  87.2 88.4 89.0  PLT 170 98* 103*  --  99* 110* 622*   Basic Metabolic Panel: Recent Labs  Lab 03/01/21 1819 03/03/21 0247 03/04/21 0508  NA 129* 132* 130*  K 4.6 4.1 4.6  CL 97* 100 102  CO2 27 26 25   GLUCOSE 173* 86 127*  BUN 37* 30* 26*  CREATININE 1.74* 1.37* 1.35*  CALCIUM 8.4* 8.0* 7.9*  MG  --   --  2.2   GFR: Estimated Creatinine Clearance: 36.5 mL/min (A) (by C-G formula based on SCr of 1.35 mg/dL (H)). Liver Function Tests: Recent Labs  Lab 03/01/21 1819 03/04/21 0508  AST 25 29  ALT 15 13  ALKPHOS 73 60  BILITOT 1.7* 1.8*  PROT 5.8* 5.1*  ALBUMIN 2.4* 2.0*   No results for input(s): LIPASE, AMYLASE in the last 168 hours. No results for input(s): AMMONIA in the last 168 hours. Coagulation Profile: No results for input(s): INR, PROTIME in the last 168 hours. Cardiac Enzymes: No results for input(s): CKTOTAL, CKMB, CKMBINDEX, TROPONINI in the last 168 hours. BNP (last 3 results) No results for input(s): PROBNP in the last 8760 hours. HbA1C: Recent Labs    03/03/21 0247  HGBA1C 5.3   CBG: Recent Labs  Lab 03/03/21 1227 03/03/21 1306 03/03/21 1552 03/04/21 0727 03/04/21 1134  GLUCAP 81 85 98 124* 212*   Lipid Profile: No results for  input(s): CHOL, HDL, LDLCALC, TRIG, CHOLHDL, LDLDIRECT in the last 72 hours. Thyroid Function Tests: Recent Labs    03/03/21 0750  TSH 5.511*   Anemia Panel: No results for input(s): VITAMINB12, FOLATE, FERRITIN, TIBC, IRON, RETICCTPCT in the last 72 hours. Sepsis Labs: Recent Labs  Lab 03/01/21 2157 03/03/21 0247 03/04/21 0508  PROCALCITON <0.10 0.10 0.24    Recent Results (from the past 240 hour(s))  Resp Panel by RT-PCR (Flu A&B, Covid) Nasopharyngeal Swab     Status: None   Collection Time: 03/01/21  6:16 PM   Specimen: Nasopharyngeal Swab; Nasopharyngeal(NP) swabs in vial transport medium  Result Value Ref Range Status   SARS Coronavirus 2 by RT PCR NEGATIVE NEGATIVE Final    Comment: (NOTE) SARS-CoV-2 target nucleic acids are NOT DETECTED.  The SARS-CoV-2 RNA is generally detectable in upper respiratory specimens during the acute phase of infection. The lowest concentration of SARS-CoV-2 viral copies this assay can detect is 138 copies/mL. A negative result does not preclude SARS-Cov-2 infection and should not be used as the sole basis for treatment or other patient management decisions. A negative result may occur  with  improper specimen collection/handling, submission of specimen other than nasopharyngeal swab, presence of viral mutation(s) within the areas targeted by this assay, and inadequate number of viral copies(<138 copies/mL). A negative result must be combined with clinical observations, patient history, and epidemiological information. The expected result is Negative.  Fact Sheet for Patients:  EntrepreneurPulse.com.au  Fact Sheet for Healthcare Providers:  IncredibleEmployment.be  This test is no t yet approved or cleared by the [place] FDA and  has been authorized for detection and/or diagnosis of SARS-CoV-2 by FDA under an Emergency Use Authorization (EUA). This EUA will remain  in effect (meaning this test  can be used) for the duration of the COVID-19 declaration under Section 564(b)(1) of the Act, 21 U.S.C.section 360bbb-3(b)(1), unless the authorization is terminated  or revoked sooner.       Influenza A by PCR NEGATIVE NEGATIVE Final   Influenza B by PCR NEGATIVE NEGATIVE Final    Comment: (NOTE) The Xpert Xpress SARS-CoV-2/FLU/RSV plus assay is intended as an aid in the diagnosis of influenza from Nasopharyngeal swab specimens and should not be used as a sole basis for treatment. Nasal washings and aspirates are unacceptable for Xpert Xpress SARS-CoV-2/FLU/RSV testing.  Fact Sheet for Patients: EntrepreneurPulse.com.au  Fact Sheet for Healthcare Providers: IncredibleEmployment.be  This test is not yet approved or cleared by the [place] FDA and has been authorized for detection and/or diagnosis of SARS-CoV-2 by FDA under an Emergency Use Authorization (EUA). This EUA will remain in effect (meaning this test can be used) for the duration of the COVID-19 declaration under Section 564(b)(1) of the Act, 21 U.S.C. section 360bbb-3(b)(1), unless the authorization is terminated or revoked.  Performed at [facility], [address] [place], [place] [address]   MRSA Next Gen by PCR, Nasal     Status: None   Collection Time: 03/02/21  9:43 PM   Specimen: Nasal Mucosa; Nasal Swab  Result Value Ref Range Status   MRSA by PCR Next Gen NOT DETECTED NOT DETECTED Final    Comment: (NOTE) The GeneXpert MRSA Assay (FDA approved for NASAL specimens only), is one component of a comprehensive MRSA colonization surveillance program. It is not intended to diagnose MRSA infection nor to guide or monitor treatment for MRSA infections. Test performance is not FDA approved in patients less than 32 years old. Performed at [facility], [address] [place], [place] [address]      Radiology Studies: No results found.  Scheduled  Meds:  Chlorhexidine Gluconate Cloth  6 each Topical Q0600   ferrous gluconate  324 mg Oral TID WC   insulin aspart  0-9 Units Subcutaneous TID WC   levothyroxine  75 mcg Oral Q0600   pantoprazole (PROTONIX) IV  40 mg Intravenous Q12H   propranolol  10 mg Oral BID   Continuous Infusions:  cefTRIAXone (ROCEPHIN)  IV 1 g (03/04/21 1200)     LOS: 2 days   Time spent: 45 minutes. More than 50% of the time was spent in counseling/coordination of care  Lorella Nimrod, MD [facility]  If 7PM-7AM, please contact night-coverage [email or web address]  03/04/2021, 2:00 PM   This record has been created using Systems analyst. Errors have been sought and corrected,but may not always be located. Such creation errors do not reflect on the standard of care.

## 2021-03-05 ENCOUNTER — Other Ambulatory Visit: Payer: Self-pay | Admitting: *Deleted

## 2021-03-05 DIAGNOSIS — D649 Anemia, unspecified: Secondary | ICD-10-CM

## 2021-03-05 DIAGNOSIS — Z7189 Other specified counseling: Secondary | ICD-10-CM

## 2021-03-05 DIAGNOSIS — Z515 Encounter for palliative care: Secondary | ICD-10-CM | POA: Diagnosis not present

## 2021-03-05 DIAGNOSIS — D62 Acute posthemorrhagic anemia: Secondary | ICD-10-CM | POA: Diagnosis not present

## 2021-03-05 DIAGNOSIS — K31819 Angiodysplasia of stomach and duodenum without bleeding: Secondary | ICD-10-CM | POA: Diagnosis not present

## 2021-03-05 DIAGNOSIS — Z66 Do not resuscitate: Secondary | ICD-10-CM

## 2021-03-05 LAB — BASIC METABOLIC PANEL
Anion gap: 4 — ABNORMAL LOW (ref 5–15)
BUN: 23 mg/dL (ref 8–23)
CO2: 26 mmol/L (ref 22–32)
Calcium: 7.8 mg/dL — ABNORMAL LOW (ref 8.9–10.3)
Chloride: 99 mmol/L (ref 98–111)
Creatinine, Ser: 1.37 mg/dL — ABNORMAL HIGH (ref 0.44–1.00)
GFR, Estimated: 40 mL/min — ABNORMAL LOW (ref >60)
Glucose, Bld: 144 mg/dL — ABNORMAL HIGH (ref 70–99)
Potassium: 4 mmol/L (ref 3.5–5.1)
Sodium: 129 mmol/L — ABNORMAL LOW (ref 135–145)

## 2021-03-05 LAB — CBC
HCT: 24.8 % — ABNORMAL LOW (ref 36.0–46.0)
Hemoglobin: 7.8 g/dL — ABNORMAL LOW (ref 12.0–15.0)
MCH: 28.7 pg (ref 26.0–34.0)
MCHC: 31.5 g/dL (ref 30.0–36.0)
MCV: 91.2 fL (ref 80.0–100.0)
Platelets: 124 10*3/uL — ABNORMAL LOW (ref 150–400)
RBC: 2.72 MIL/uL — ABNORMAL LOW (ref 3.87–5.11)
RDW: 19.5 % — ABNORMAL HIGH (ref 11.5–15.5)
WBC: 4.3 10*3/uL (ref 4.0–10.5)
nRBC: 0 % (ref 0.0–0.2)

## 2021-03-05 LAB — HEMOGLOBIN AND HEMATOCRIT, BLOOD
HCT: 29.1 % — ABNORMAL LOW (ref 36.0–46.0)
Hemoglobin: 9.2 g/dL — ABNORMAL LOW (ref 12.0–15.0)

## 2021-03-05 LAB — GLUCOSE, CAPILLARY
Glucose-Capillary: 151 mg/dL — ABNORMAL HIGH (ref 70–99)
Glucose-Capillary: 197 mg/dL — ABNORMAL HIGH (ref 70–99)
Glucose-Capillary: 165 mg/dL — ABNORMAL HIGH (ref 70–99)

## 2021-03-05 LAB — PREPARE RBC (CROSSMATCH)

## 2021-03-05 MED ORDER — FUROSEMIDE 20 MG PO TABS
40.0000 mg | ORAL_TABLET | Freq: Every day | ORAL | 1 refills | Status: DC
Start: 2021-03-05 — End: 2021-03-16

## 2021-03-05 MED ORDER — SODIUM CHLORIDE 0.9% IV SOLUTION: Freq: Once | INTRAVENOUS | Status: AC

## 2021-03-05 MED ORDER — FERROUS SULFATE 325 (65 FE) MG PO TABS
325.0000 mg | ORAL_TABLET | Freq: Two times a day (BID) | ORAL | 3 refills | Status: AC
Start: 2021-03-05 — End: ?

## 2021-03-05 MED ORDER — VITAMIN B-12 1000 MCG PO TABS: 1000.0000 ug | ORAL_TABLET | Freq: Every day | ORAL | 1 refills | Status: AC

## 2021-03-05 NOTE — Progress Notes (Signed)
Blood pressure (!) 121/55, pulse 71, temperature 97.7 F (36.5 C), temperature source Oral, resp. rate 18, height 5\' 3"  (1.6 m), weight 89.9 kg, SpO2 98 %. IV removed site c/d/I d/c packet discussed with pt where she verbalized understanding on the information provided pt discharged with all personal belongings and transported via w/c down to private car.

## 2021-03-05 NOTE — Care Management Important Message (Signed)
Important Message  Patient Details  Name: April Blake MRN: [id number] Date of Birth: Jul 12, 1940   Medicare Important Message Given:  Yes     Juliann Pulse A Sabryn Preslar 03/05/2021, 11:32 AM

## 2021-03-05 NOTE — TOC Initial Note (Signed)
Transition of Care (TOC) - Initial/Assessment Note    Patient Details  Name: April Blake MRN: [id number] Date of Birth: 1940-10-29  Transition of Care [facility]) CM/SW Contact:    Conception Oms, RN Phone Number: 03/05/2021, 11:15 AM  Clinical Narrative:   Met with the patient and her daughter in the room to discuss DC plan and needs She lives at home with her daughter She has a rolling walker, a 3 in 1 and transport Wheelchair She has transportation with her daughter She goes to the cancer center to get blood transfusions monthly She requested to be set up for [facility] PT, OT and RN thru [employer], she has had them in the past and was pleased with them               She will be followed at home by Palliative nursing She does not have any needs for more DME NO further needs, plans to DC later today          Patient Goals and CMS Choice        Expected Discharge Plan and Services                                                Prior Living Arrangements/Services                       Activities of Daily Living   ADL Screening (condition at time of admission) Patient's cognitive ability adequate to safely complete daily activities?: Yes Is the patient deaf or have difficulty hearing?: No Does the patient have difficulty seeing, even when wearing glasses/contacts?: No Does the patient have difficulty concentrating, remembering, or making decisions?: No Patient able to express need for assistance with ADLs?: Yes Does the patient have difficulty dressing or bathing?: No Independently performs ADLs?: Yes (appropriate for developmental age) Does the patient have difficulty walking or climbing stairs?: No Weakness of Legs: Both Weakness of Arms/Hands: Both  Permission Sought/Granted                  Emotional Assessment              Admission diagnosis:  Hyponatremia [E87.1] Weakness [R53.1] Symptomatic anemia [D64.9] Acute kidney injury  superimposed on chronic kidney disease ([place]) [N17.9, N18.9] Anemia, unspecified type [D64.9] Patient Active Problem List   Diagnosis Date Noted   Acute on chronic blood loss anemia 03/02/2021   Symptomatic anemia 03/02/2021   Abnormal gait 09/30/2020   Cardiovascular symptoms 09/30/2020   Chronic kidney disease, stage 3b ([place]) 09/30/2020   Esophageal varices with bleeding ([place]) 10/03/1599   Umbilical hernia 09/32/3557   Fatigue 09/30/2020   Type 2 diabetes with nephropathy ([place]) 09/30/2020   Hyperlipidemia associated with type 2 diabetes mellitus ([place]) 09/30/2020   Impaired mobility 09/30/2020   Insomnia 09/30/2020   Osteoarthritis of knee 09/30/2020   Personal history of colonic polyps 09/30/2020   Chronic blood loss anemia 09/27/2020   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   Uterine cancer ([place]) 04/09/2020   Upper GI bleed 03/16/2018   Bilateral lower extremity edema 02/09/2018   Coronary artery calcification seen on CT scan 12/01/2017   Exertional dyspnea 12/01/2017   Ascites 10/24/2013   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

## 2021-03-05 NOTE — Consult Note (Signed)
Consultation Note Date: 03/05/2021   Patient Name: April Blake  DOB: [birth year]  MRN: [id number]  Age / Sex: 80 y.o., female  PCP: Olin Hauser, DO Referring Physician: Lorella Nimrod, MD  Reason for Consultation: Establishing goals of care  HPI/Patient Profile: 80 y.o. female  with past medical history of DM2, CKD 3B, hypothyroidism, endometrial cancer, [name] cirrhosis complicated by portal hypertensive gastropathy history of GAVE as well as variceal bleed, and ongoing blood loss anemia requiring blood transfusions every 2 to 3 months admitted on 03/01/2021 and found to have hgb of 5. Sigmoidoscopy with very friable colonic mucosa, consistent with portal gastropathy and postradiation proctitis.  Received 3 units of PRBC. PMT consulted to discuss [place].  Clinical Assessment and Goals of Care: I have reviewed medical records including EPIC notes, labs and imaging, received report from RN, assessed the patient and then met with patient and daughter Manuela Schwartz to discuss diagnosis prognosis, GOC, EOL wishes, disposition and options.  I introduced Palliative Medicine as specialized medical care for people living with serious illness. It focuses on providing relief from the symptoms and stress of a serious illness. The goal is to improve quality of life for both the patient and the family.  We discussed a brief life review of the patient. Patient lives with her daughter Manuela Schwartz.  As far as functional and nutritional status, she has had some decline in function but still mostly independent.    We discussed patient's current illness and what it means in the larger context of patient's on-going co-morbidities.  Natural disease trajectory and expectations at EOL were discussed. We discussed expected continued bleeding with no options to improve. She understands and tells me "I know I'm slowly dying".   I attempted to elicit values and goals of care  important to the patient. For now, the patient feels as though she has good quality of life and is benefiting from ongoing blood transfusions.     The difference between aggressive medical intervention and comfort care was considered in light of the patient's goals of care.   The patient is not interested in repeat hospitalizations but would like to continue her blood transfusions as needed. We discussed option of hospice - discussed philosophy of care as well as type of support provided. Patient would be interested in hospice if she could continue blood transfusions - discussed with case manager and hospice liaison - hospice unable to support ongoing blood transfusions. Discussed with patient and daughters - will refer to outpatient to palliative and they will transition to hospice outpatient when they feel blood transfusions are no longer benefiting patient.   Discussed with patient/family the importance of continued conversation with family and the medical providers regarding overall plan of care and treatment options, ensuring decisions are within the context of the patient's values and GOCs.    Encouraged patient/family to consider DNR/DNI status understanding evidenced based poor outcomes in similar hospitalized patients, as the cause of the arrest is likely associated with chronic/terminal disease rather than a reversible acute cardio-pulmonary event. After time was given for patient to consider and discuss with daughters patient agreed to DNR.   Questions and concerns were addressed. The family was encouraged to call with questions or concerns.    Primary Decision Maker PATIENT    SUMMARY OF RECOMMENDATIONS   Code status change to DNR - signed copies given to patient and placed on chart Home with outpatient palliative - transition to hospice when appropriate Continue home health  Code Status/Advance Care  Planning: DNR     Primary Diagnoses: Present on Admission:  Chronic kidney  disease, stage 3b ([place])  Liver cirrhosis secondary to NASH ([place])  GAVE (gastric antral vascular ectasia)  Type 2 diabetes with nephropathy (HCC)  Hypothyroidism  Uterine cancer (HCC)  Symptomatic anemia   I have reviewed the medical record, interviewed the patient and family, and examined the patient. The following aspects are pertinent.  Past Medical History:  Diagnosis Date   Anemia    Heart disease    Hyperlipidemia    Hypertension    Liver disease    Thyroid disease    Uterine cancer ([place])    Social History   Socioeconomic History   Marital status: Widowed    Spouse name: Not on file   Number of children: Not on file   Years of education: Not on file   Highest education level: Not on file  Occupational History   Not on file  Tobacco Use   Smoking status: Former    Packs/day: 0.20    Years: 5.00    Pack years: 1.00    Types: Cigarettes   Smokeless tobacco: Never  Substance and Sexual Activity   Alcohol use: Never   Drug use: Never   Sexual activity: Not on file  Other Topics Concern   Not on file  Social History Narrative   Not on file   Social Determinants of Health   Financial Resource Strain: Not on file  Food Insecurity: Not on file  Transportation Needs: Not on file  Physical Activity: Not on file  Stress: Not on file  Social Connections: Not on file   No family history on file. Scheduled Meds:  Chlorhexidine Gluconate Cloth  6 each Topical Q0600   ferrous gluconate  324 mg Oral TID WC   insulin aspart  0-9 Units Subcutaneous TID WC   levothyroxine  75 mcg Oral Q0600   pantoprazole (PROTONIX) IV  40 mg Intravenous Q12H   propranolol  10 mg Oral BID   Continuous Infusions:  cefTRIAXone (ROCEPHIN)  IV 1 g (03/05/21 1246)   PRN Meds:.albuterol No Known Allergies Review of Systems  Constitutional:  Positive for activity change and fatigue. Negative for appetite change.  Respiratory:  Negative for shortness of breath.   Neurological:   Positive for weakness.   Physical Exam Pulmonary:     Effort: Pulmonary effort is normal.  Skin:    General: Skin is warm and dry.  Neurological:     Mental Status: She is alert and oriented to person, place, and time.  Psychiatric:        Mood and Affect: Mood normal.        Behavior: Behavior normal.    Vital Signs: BP (!) 123/49 (BP Location: Left Arm)   Pulse 80   Temp 97.9 F (36.6 C)   Resp 18   Ht _0  (1.6 m)   Wt 89.9 kg   SpO2 94%   BMI 35.11 kg/m  Pain Scale: 0-10   Pain Score: 0-No pain   SpO2: SpO2: 94 % O2 Device:SpO2: 94 % O2 Flow Rate: .O2 Flow Rate (L/min): 2 L/min  IO: Intake/output summary:  Intake/Output Summary (Last 24 hours) at 03/05/2021 1319 Last data filed at 03/05/2021 1011 Gross per 24 hour  Intake 220 ml  Output 200 ml  Net 20 ml    LBM: Last BM Date: 03/04/21 Baseline Weight: Weight: 81.6 kg Most recent weight: Weight: 89.9 kg     Palliative Assessment/Data:  PPS 50%    Time Total: 85 minutes Greater than 50%  of this time was spent counseling and coordinating care related to the above assessment and plan.  Juel Burrow, DNP, AGNP-C Palliative Medicine Team [phone] Pager: [phone]

## 2021-03-05 NOTE — Discharge Summary (Signed)
Physician Discharge Summary  April Blake QMG:[id number] DOB: May 29, 1940 DOA: 03/01/2021  PCP: Olin Hauser, DO  Admit date: 03/01/2021 Discharge date: 03/05/2021  Admitted From: Home  disposition: Home  Recommendations for Outpatient Follow-up:  Follow up with PCP in 1-2 weeks Follow-up with outpatient palliative care Please obtain BMP/CBC in one week Please follow up on the following pending results: None  Home Health: Yes Equipment/Devices: Rolling walker Discharge Condition: Stable CODE STATUS: DNR Diet recommendation: Heart Healthy / Carb Modified   Brief/Interim Summary: 80 year old female with DM2, CKD 3B, hypothyroidism, endometrial cancer, [name] cirrhosis complicated by portal hypertensive gastropathy history of GAVE as well as variceal bleed, ongoing blood loss anemia requiring blood transfusions every 2 to 3 months comes into the ED with weakness, lightheadedness, slurred speech and a hemoglobin of 5.0.  Secondary to hematochezia.  Sigmoidoscopy with very friable colonic mucosa, consistent with portal gastropathy and postradiation proctitis.   Received total of 4 units of PRBC.  Palliative care was also consulted due to concern of life-threatening GI bleed and chronic portal gastropathy.  Patient and family like to continue with intermittent blood transfusions, CODE STATUS changed to DNR.  They will consider hospice if she does not get benefit from intermittent blood transfusions.  They will follow-up with palliative care as an outpatient.  Her acute metabolic encephalopathy and slurred speech was most likely secondary to acute bleeding.  Imaging was negative for any acute CVA.  Symptoms resolved and she was at baseline before discharge.  Patient has chronic mild hyponatremia secondary to liver cirrhosis and uterine malignancy.  Remained stable.  Patient has CKD stage IIIa and creatinine remained at baseline.  She will continue rest of her home medications and  follow-up with her primary care provider and keep getting intermittent blood transfusions as needed.  Discharge Diagnoses:  Principal Problem:   Acute on chronic blood loss anemia Active Problems:   Chronic kidney disease, stage 3b (HCC)   Liver cirrhosis secondary to NASH (HCC)   GAVE (gastric antral vascular ectasia)   Type 2 diabetes with nephropathy (HCC)   Hypothyroidism   Uterine cancer (HCC)   Symptomatic anemia   Discharge Instructions  Discharge Instructions     Diet - low sodium heart healthy   Complete by: As directed    Discharge instructions   Complete by: As directed    It was pleasure taking care of you. Your being given B12 supplement which you can take it with your iron supplement. Continue taking omeprazole twice daily. Avoid constipation and follow-up closely with your gastroenterologist for further recommendations.   Increase activity slowly   Complete by: As directed       Allergies as of 03/05/2021   No Known Allergies      Medication List     STOP taking these medications    bisacodyl 5 MG EC tablet Commonly known as: DULCOLAX   Breztri Aerosphere 160-9-4.8 MCG/ACT Aero Generic drug: Budeson-Glycopyrrol-Formoterol   Co Q-10 200 MG Caps   mesalamine 4 g enema Commonly known as: ROWASA   predniSONE 10 MG tablet Commonly known as: DELTASONE   Procysbi 300 MG Pack Generic drug: Cysteamine Bitartrate       TAKE these medications    Accu-Chek Aviva Plus test strip Generic drug: glucose blood   Accu-Chek Guide test strip Generic drug: glucose blood Check BS 4/day -Fluctuating blood sugars   Accu-Chek Guide test strip Generic drug: glucose blood 1 each 4 (four) times daily.   acetaminophen 500 MG tablet  Commonly known as: TYLENOL Take 1,000 mg by mouth every 4 (four) hours as needed for mild pain.   albuterol 108 (90 Base) MCG/ACT inhaler Commonly known as: VENTOLIN HFA   diclofenac Sodium 1 % Gel Commonly known as:  Voltaren Apply 2 g topically 4 (four) times daily as needed (osteoarthritis knee pain).   ferrous sulfate 325 (65 FE) MG tablet Take 1 tablet (325 mg total) by mouth 2 (two) times daily with a meal. What changed:  how to take this when to take this   furosemide 20 MG tablet Commonly known as: LASIX Take 2 tablets (40 mg total) by mouth daily. What changed: how much to take   insulin regular 100 units/mL injection Commonly known as: NOVOLIN R Inject 6-12 Units into the skin 2 (two) times daily before a meal.   ipratropium-albuterol 0.5-2.5 (3) MG/3ML Soln Commonly known as: DUONEB Take 3 mLs by nebulization 4 (four) times daily as needed.   Lantus SoloStar 100 UNIT/ML Solostar Pen Generic drug: insulin glargine 20 units in the morning   levothyroxine 75 MCG tablet Commonly known as: SYNTHROID 1 tablet in the morning on an empty stomach   omeprazole 40 MG capsule Commonly known as: PRILOSEC Take 1 capsule (40 mg total) by mouth 2 (two) times daily before a meal.   ondansetron 4 MG tablet Commonly known as: ZOFRAN   polyethylene glycol powder 17 GM/SCOOP powder Commonly known as: GLYCOLAX/MIRALAX Take by mouth.   propranolol 10 MG tablet Commonly known as: INDERAL Take 10 mg by mouth 2 (two) times daily.   simvastatin 20 MG tablet Commonly known as: ZOCOR Take 1 tablet (20 mg total) by mouth at bedtime.   spironolactone 50 MG tablet Commonly known as: ALDACTONE Take 100 mg by mouth daily.   vitamin B-12 1000 MCG tablet Commonly known as: CYANOCOBALAMIN Take 1 tablet (1,000 mcg total) by mouth daily.        Follow-up Information     Olin Hauser, DO. Schedule an appointment as soon as possible for a visit in 1 week(s).   Specialty: Family Medicine Contact information: [place] [address] [phone]                No Known Allergies  Consultations: Palliative care GI  Procedures/Studies: DG Chest 2 View  Result  Date: 03/01/2021 CLINICAL DATA:  Weakness. EXAM: CHEST - 2 VIEW COMPARISON:  None FINDINGS: Small left pleural effusion and left lower lobe atelectasis or infiltrate. The right lung is clear. No pneumothorax the cardiac silhouette is within limits. No acute osseous pathology. IMPRESSION: Small left pleural effusion and left lower lobe atelectasis or infiltrate. Electronically Signed   By: Anner Crete M.D.   On: 03/01/2021 19:53   CT Head Wo Contrast  Result Date: 03/01/2021 CLINICAL DATA:  Transient ischemic attack EXAM: CT HEAD WITHOUT CONTRAST TECHNIQUE: Contiguous axial images were obtained from the base of the skull through the vertex without intravenous contrast. COMPARISON:  None. BRAIN: BRAIN Patchy and confluent areas of decreased attenuation are noted throughout the deep and periventricular white matter of the cerebral hemispheres bilaterally, compatible with chronic microvascular ischemic disease. No evidence of large-territorial acute infarction. No parenchymal hemorrhage. No mass lesion. No extra-axial collection. No mass effect or midline shift. No hydrocephalus. Basilar cisterns are patent. Vascular: No hyperdense vessel. Atherosclerotic calcifications are present within the cavernous internal carotid arteries. Skull: No acute fracture or focal lesion. Sinuses/Orbits: Left maxillary mucosal thickening. Otherwise paranasal sinuses and mastoid air  cells are clear. Bilateral lens replacement. Otherwise orbits are unremarkable. Other: None. IMPRESSION: No acute intracranial abnormality. Electronically Signed   By: Iven Finn M.D.   On: 03/01/2021 19:24   MR BRAIN WO CONTRAST  Result Date: 03/01/2021 CLINICAL DATA:  Mental status change, unknown cause EXAM: MRI HEAD WITHOUT CONTRAST TECHNIQUE: Multiplanar, multiecho pulse sequences of the brain and surrounding structures were obtained without intravenous contrast. COMPARISON:  No prior MRI, correlation is made with CT head 03/01/2021  FINDINGS: Evaluation is limited by motion artifact. Brain: No restricted diffusion to suggest acute infarction; diffusion-weighted sequences are not as motion limited. No definite acute hemorrhage, mass, mass effect, or midline shift. Confluent T2 hyperintense signal in the periventricular white matter, likely the sequela of severe chronic small vessel ischemic disease. Vascular: Normal flow voids. Skull and upper cervical spine: Unable to evaluate secondary to motion. Sinuses/Orbits: Left maxillary mucous retention cyst. Status post bilateral lens replacements. Other: The mastoids are well aerated. IMPRESSION: Evaluation is limited by motion, which affects nearly all sequences. Within this limitation, no acute intracranial process is seen. Electronically Signed   By: Merilyn Baba M.D.   On: 03/01/2021 23:57    Subjective: Patient was seen and examined today.  No new complaints.  Denies any more bleeding.  She wants to go home after getting another unit of blood.  Discharge Exam: Vitals:   03/05/21 1431 03/05/21 1500  BP: (!) 109/58 (!) 112/59  Pulse: 77 69  Resp: 18 18  Temp: (!) 97.4 F (36.3 C) (!) 97.5 F (36.4 C)  SpO2: 100% 98%   Vitals:   03/05/21 0724 03/05/21 1046 03/05/21 1431 03/05/21 1500  BP: 118/65 (!) 123/49 (!) 109/58 (!) 112/59  Pulse: 70 80 77 69  Resp: 16 18 18 18   Temp: 98.3 F (36.8 C) 97.9 F (36.6 C) (!) 97.4 F (36.3 C) (!) 97.5 F (36.4 C)  TempSrc:   Oral Oral  SpO2: 99% 94% 100% 98%  Weight:      Height:        General: Pt is alert, awake, not in acute distress Cardiovascular: RRR, S1/S2 +, no rubs, no gallops Respiratory: CTA bilaterally, no wheezing, no rhonchi Abdominal: Soft, NT, ND, bowel sounds + Extremities: no edema, no cyanosis   The results of significant diagnostics from this hospitalization (including imaging, microbiology, ancillary and laboratory) are listed below for reference.    Microbiology: Recent Results (from the past 240  hour(s))  Resp Panel by RT-PCR (Flu A&B, Covid) Nasopharyngeal Swab     Status: None   Collection Time: 03/01/21  6:16 PM   Specimen: Nasopharyngeal Swab; Nasopharyngeal(NP) swabs in vial transport medium  Result Value Ref Range Status   SARS Coronavirus 2 by RT PCR NEGATIVE NEGATIVE Final    Comment: (NOTE) SARS-CoV-2 target nucleic acids are NOT DETECTED.  The SARS-CoV-2 RNA is generally detectable in upper respiratory specimens during the acute phase of infection. The lowest concentration of SARS-CoV-2 viral copies this assay can detect is 138 copies/mL. A negative result does not preclude SARS-Cov-2 infection and should not be used as the sole basis for treatment or other patient management decisions. A negative result may occur with  improper specimen collection/handling, submission of specimen other than nasopharyngeal swab, presence of viral mutation(s) within the areas targeted by this assay, and inadequate number of viral copies(<138 copies/mL). A negative result must be combined with clinical observations, patient history, and epidemiological information. The expected result is Negative.  Fact Sheet for Patients:  EntrepreneurPulse.com.au  Fact Sheet for Healthcare Providers:  IncredibleEmployment.be  This test is no t yet approved or cleared by the [place] FDA and  has been authorized for detection and/or diagnosis of SARS-CoV-2 by FDA under an Emergency Use Authorization (EUA). This EUA will remain  in effect (meaning this test can be used) for the duration of the COVID-19 declaration under Section 564(b)(1) of the Act, 21 U.S.C.section 360bbb-3(b)(1), unless the authorization is terminated  or revoked sooner.       Influenza A by PCR NEGATIVE NEGATIVE Final   Influenza B by PCR NEGATIVE NEGATIVE Final    Comment: (NOTE) The Xpert Xpress SARS-CoV-2/FLU/RSV plus assay is intended as an aid in the diagnosis of influenza from  Nasopharyngeal swab specimens and should not be used as a sole basis for treatment. Nasal washings and aspirates are unacceptable for Xpert Xpress SARS-CoV-2/FLU/RSV testing.  Fact Sheet for Patients: EntrepreneurPulse.com.au  Fact Sheet for Healthcare Providers: IncredibleEmployment.be  This test is not yet approved or cleared by the [place] FDA and has been authorized for detection and/or diagnosis of SARS-CoV-2 by FDA under an Emergency Use Authorization (EUA). This EUA will remain in effect (meaning this test can be used) for the duration of the COVID-19 declaration under Section 564(b)(1) of the Act, 21 U.S.C. section 360bbb-3(b)(1), unless the authorization is terminated or revoked.  Performed at [facility], [address] [place], [place] [address]   MRSA Next Gen by PCR, Nasal     Status: None   Collection Time: 03/02/21  9:43 PM   Specimen: Nasal Mucosa; Nasal Swab  Result Value Ref Range Status   MRSA by PCR Next Gen NOT DETECTED NOT DETECTED Final    Comment: (NOTE) The GeneXpert MRSA Assay (FDA approved for NASAL specimens only), is one component of a comprehensive MRSA colonization surveillance program. It is not intended to diagnose MRSA infection nor to guide or monitor treatment for MRSA infections. Test performance is not FDA approved in patients less than 23 years old. Performed at [facility], [address] [place], [place] [address]      Labs: BNP (last 3 results) Recent Labs    03/01/21 1819  BNP 935.7*   Basic Metabolic Panel: Recent Labs  Lab 03/01/21 1819 03/03/21 0247 03/04/21 0508 03/05/21 0819  NA 129* 132* 130* 129*  K 4.6 4.1 4.6 4.0  CL 97* 100 102 99  CO2 27 26 25 26   GLUCOSE 173* 86 127* 144*  BUN 37* 30* 26* 23  CREATININE 1.74* 1.37* 1.35* 1.37*  CALCIUM 8.4* 8.0* 7.9* 7.8*  MG  --   --  2.2  --    Liver Function Tests: Recent Labs  Lab 03/01/21 1819  03/04/21 0508  AST 25 29  ALT 15 13  ALKPHOS 73 60  BILITOT 1.7* 1.8*  PROT 5.8* 5.1*  ALBUMIN 2.4* 2.0*   No results for input(s): LIPASE, AMYLASE in the last 168 hours. No results for input(s): AMMONIA in the last 168 hours. CBC: Recent Labs  Lab 03/01/21 1819 03/02/21 1000 03/02/21 1224 03/02/21 1721 03/03/21 0247 03/03/21 1612 03/04/21 0508 03/05/21 0819  WBC 4.4   < > 2.2*  --  2.1* 2.7* 4.0 4.3  NEUTROABS 3.3  --  1.6*  --   --   --   --   --   HGB 5.0*   < > 6.6* 7.3* 7.8* 8.2* 8.1* 7.8*  HCT 17.1*   < > 21.3* 23.1* 23.8* 25.1* 25.0*  24.8*  MCV 93.4   < > 87.7  --  87.2 88.4 89.0 91.2  PLT 170   < > 103*  --  99* 110* 133* 124*   < > = values in this interval not displayed.   Cardiac Enzymes: No results for input(s): CKTOTAL, CKMB, CKMBINDEX, TROPONINI in the last 168 hours. BNP: Invalid input(s): POCBNP CBG: Recent Labs  Lab 03/04/21 1640 03/04/21 2129 03/05/21 0728 03/05/21 1211 03/05/21 1614  GLUCAP 185* 206* 151* 197* 165*   D-Dimer No results for input(s): DDIMER in the last 72 hours. Hgb A1c Recent Labs    03/03/21 0247  HGBA1C 5.3   Lipid Profile No results for input(s): CHOL, HDL, LDLCALC, TRIG, CHOLHDL, LDLDIRECT in the last 72 hours. Thyroid function studies Recent Labs    03/03/21 0750  TSH 5.511*   Anemia work up No results for input(s): VITAMINB12, FOLATE, FERRITIN, TIBC, IRON, RETICCTPCT in the last 72 hours. Urinalysis    Component Value Date/Time   COLORURINE YELLOW (A) 03/01/2021 1722   APPEARANCEUR CLEAR (A) 03/01/2021 1722   LABSPEC 1.015 03/01/2021 1722   PHURINE 7.0 03/01/2021 1722   GLUCOSEU NEGATIVE 03/01/2021 1722   HGBUR TRACE (A) 03/01/2021 1722   BILIRUBINUR NEGATIVE 03/01/2021 1722   KETONESUR NEGATIVE 03/01/2021 1722   PROTEINUR NEGATIVE 03/01/2021 1722   NITRITE NEGATIVE 03/01/2021 1722   LEUKOCYTESUR NEGATIVE 03/01/2021 1722   Sepsis Labs Invalid input(s): PROCALCITONIN,  WBC,   LACTICIDVEN Microbiology Recent Results (from the past 240 hour(s))  Resp Panel by RT-PCR (Flu A&B, Covid) Nasopharyngeal Swab     Status: None   Collection Time: 03/01/21  6:16 PM   Specimen: Nasopharyngeal Swab; Nasopharyngeal(NP) swabs in vial transport medium  Result Value Ref Range Status   SARS Coronavirus 2 by RT PCR NEGATIVE NEGATIVE Final    Comment: (NOTE) SARS-CoV-2 target nucleic acids are NOT DETECTED.  The SARS-CoV-2 RNA is generally detectable in upper respiratory specimens during the acute phase of infection. The lowest concentration of SARS-CoV-2 viral copies this assay can detect is 138 copies/mL. A negative result does not preclude SARS-Cov-2 infection and should not be used as the sole basis for treatment or other patient management decisions. A negative result may occur with  improper specimen collection/handling, submission of specimen other than nasopharyngeal swab, presence of viral mutation(s) within the areas targeted by this assay, and inadequate number of viral copies(<138 copies/mL). A negative result must be combined with clinical observations, patient history, and epidemiological information. The expected result is Negative.  Fact Sheet for Patients:  EntrepreneurPulse.com.au  Fact Sheet for Healthcare Providers:  IncredibleEmployment.be  This test is no t yet approved or cleared by the [place] FDA and  has been authorized for detection and/or diagnosis of SARS-CoV-2 by FDA under an Emergency Use Authorization (EUA). This EUA will remain  in effect (meaning this test can be used) for the duration of the COVID-19 declaration under Section 564(b)(1) of the Act, 21 U.S.C.section 360bbb-3(b)(1), unless the authorization is terminated  or revoked sooner.       Influenza A by PCR NEGATIVE NEGATIVE Final   Influenza B by PCR NEGATIVE NEGATIVE Final    Comment: (NOTE) The Xpert Xpress SARS-CoV-2/FLU/RSV plus  assay is intended as an aid in the diagnosis of influenza from Nasopharyngeal swab specimens and should not be used as a sole basis for treatment. Nasal washings and aspirates are unacceptable for Xpert Xpress SARS-CoV-2/FLU/RSV testing.  Fact Sheet for Patients: EntrepreneurPulse.com.au  Fact Sheet for Healthcare Providers:  IncredibleEmployment.be  This test is not yet approved or cleared by the [place] and has been authorized for detection and/or diagnosis of SARS-CoV-2 by FDA under an Emergency Use Authorization (EUA). This EUA will remain in effect (meaning this test can be used) for the duration of the COVID-19 declaration under Section 564(b)(1) of the Act, 21 U.S.C. section 360bbb-3(b)(1), unless the authorization is terminated or revoked.  Performed at [facility], [address] [place], [place] [address]   MRSA Next Gen by PCR, Nasal     Status: None   Collection Time: 03/02/21  9:43 PM   Specimen: Nasal Mucosa; Nasal Swab  Result Value Ref Range Status   MRSA by PCR Next Gen NOT DETECTED NOT DETECTED Final    Comment: (NOTE) The GeneXpert MRSA Assay (FDA approved for NASAL specimens only), is one component of a comprehensive MRSA colonization surveillance program. It is not intended to diagnose MRSA infection nor to guide or monitor treatment for MRSA infections. Test performance is not FDA approved in patients less than 26 years old. Performed at [facility], [address] [place], [place] [address]     Time coordinating discharge: Over 30 minutes  SIGNED:  Lorella Nimrod, MD  [facility] 03/05/2021, 4:44 PM  If 7PM-7AM, please contact night-coverage [email or web address]  This record has been created using Systems analyst. Errors have been sought and corrected,but may not always be located. Such creation errors do not reflect on the standard of care.

## 2021-03-06 ENCOUNTER — Telehealth: Payer: Self-pay

## 2021-03-06 ENCOUNTER — Telehealth: Payer: Self-pay | Admitting: Family Medicine

## 2021-03-06 ENCOUNTER — Telehealth: Payer: Self-pay | Admitting: Oncology

## 2021-03-06 LAB — BPAM RBC
Blood Product Expiration Date: 202212072359
ISSUE DATE / TIME: 202212011436
Unit Type and Rh: 600

## 2021-03-06 LAB — TYPE AND SCREEN
ABO/RH(D): A POS
Antibody Screen: NEGATIVE
Unit division: 0

## 2021-03-06 NOTE — Telephone Encounter (Signed)
Daughter called to get a follow up appt for hermom. She was in the hospital. Pt has lab appt on 12-8. Daughter wants pt to seen on that day if she can. Call back number is [phone]

## 2021-03-06 NOTE — Telephone Encounter (Signed)
Okay for verbal order for palliative care consult

## 2021-03-06 NOTE — Telephone Encounter (Signed)
April Blake with hospice called asking for an order for palliative care.  CB#  [id number] option 2

## 2021-03-06 NOTE — Telephone Encounter (Signed)
Transition Care Management Follow-up Telephone Call Date of discharge and from where: 03/06/21 [facility] How have you been since you were/ //released from the hospital? Doing ok per daughter Any questions or concerns?  No  Items Reviewed: Did the pt receive and understand the discharge instructions provided? Yes  Medications obtained and verified? Yes  Other? No  Any new allergies since your discharge? No  Dietary orders reviewed? No Do you have support at home? Yes   Home Care and Equipment/Supplies: Were home health services ordered? yes If so, what is the name of the agency? [facility] Has the agency set up a time to come to the patient's home? yes Were any new equipment or medical supplies ordered?  No  Functional Questionnaire: (I = Independent and D = Dependent) ADLs: I   Bathing/Dressing- I  Meal Prep- I  Eating- I  Maintaining continence- I  Transferring/Ambulation- I  Managing Meds- I  Follow up appointments reviewed:  PCP Hospital f/u appt confirmed? Yes  Scheduled to see Dr. Alla Feeling  on 12/14 @ [facility] f/u appt confirmed? No   Are transportation arrangements needed? No  If their condition worsens, is the pt aware to call PCP or go to the Emergency Dept.? Yes Was the patient provided with contact information for the PCP's office or ED? Yes Was to pt encouraged to call back with questions or concerns? Yes

## 2021-03-06 NOTE — Telephone Encounter (Signed)
Langley Gauss is aware of verbal orders.

## 2021-03-09 ENCOUNTER — Telehealth: Payer: Self-pay | Admitting: Student

## 2021-03-09 NOTE — Telephone Encounter (Signed)
Attempted to contact patient's daughter Ginger, to offer to schedule a Palliative Consult and also to answer any questions that they may have regarding Palliative services - no answer, left VM requesting a return call

## 2021-03-09 NOTE — Telephone Encounter (Signed)
Ret'd call to patient's daughter Dnya Hickle, and spoke with her about the referral/services and all questions were answered and she was in agreement with scheduling visit.  I have scheduled an In-home Consult for 03/19/21 @ 12:30 PM

## 2021-03-12 ENCOUNTER — Telehealth: Payer: Self-pay | Admitting: *Deleted

## 2021-03-12 ENCOUNTER — Inpatient Hospital Stay: Payer: Medicare Other | Attending: Oncology

## 2021-03-12 ENCOUNTER — Other Ambulatory Visit: Payer: Self-pay

## 2021-03-12 ENCOUNTER — Other Ambulatory Visit: Payer: Self-pay | Admitting: Oncology

## 2021-03-12 DIAGNOSIS — D649 Anemia, unspecified: Secondary | ICD-10-CM

## 2021-03-12 LAB — CBC WITH DIFFERENTIAL/PLATELET
Abs Immature Granulocytes: 0.02 10*3/uL (ref 0.00–0.07)
Basophils Absolute: 0 10*3/uL (ref 0.0–0.1)
Basophils Relative: 1 %
Eosinophils Absolute: 0.2 10*3/uL (ref 0.0–0.5)
Eosinophils Relative: 4 %
HCT: 23.5 % — ABNORMAL LOW (ref 36.0–46.0)
Hemoglobin: 7.2 g/dL — ABNORMAL LOW (ref 12.0–15.0)
Immature Granulocytes: 0 %
Lymphocytes Relative: 7 %
Lymphs Abs: 0.3 10*3/uL — ABNORMAL LOW (ref 0.7–4.0)
MCH: 29 pg (ref 26.0–34.0)
MCHC: 30.6 g/dL (ref 30.0–36.0)
MCV: 94.8 fL (ref 80.0–100.0)
Monocytes Absolute: 0.5 10*3/uL (ref 0.1–1.0)
Monocytes Relative: 11 %
Neutro Abs: 3.5 10*3/uL (ref 1.7–7.7)
Neutrophils Relative %: 77 %
Platelets: 131 10*3/uL — ABNORMAL LOW (ref 150–400)
RBC: 2.48 MIL/uL — ABNORMAL LOW (ref 3.87–5.11)
RDW: 19.2 % — ABNORMAL HIGH (ref 11.5–15.5)
WBC: 4.5 10*3/uL (ref 4.0–10.5)
nRBC: 0 % (ref 0.0–0.2)

## 2021-03-12 LAB — COMPREHENSIVE METABOLIC PANEL
ALT: 13 U/L (ref 0–44)
AST: 25 U/L (ref 15–41)
Albumin: 2.1 g/dL — ABNORMAL LOW (ref 3.5–5.0)
Alkaline Phosphatase: 61 U/L (ref 38–126)
Anion gap: 9 (ref 5–15)
BUN: 36 mg/dL — ABNORMAL HIGH (ref 8–23)
CO2: 26 mmol/L (ref 22–32)
Calcium: 8.1 mg/dL — ABNORMAL LOW (ref 8.9–10.3)
Chloride: 100 mmol/L (ref 98–111)
Creatinine, Ser: 1.52 mg/dL — ABNORMAL HIGH (ref 0.44–1.00)
GFR, Estimated: 35 mL/min — ABNORMAL LOW (ref >60)
Glucose, Bld: 165 mg/dL — ABNORMAL HIGH (ref 70–99)
Potassium: 4 mmol/L (ref 3.5–5.1)
Sodium: 135 mmol/L (ref 135–145)
Total Bilirubin: 1.3 mg/dL — ABNORMAL HIGH (ref 0.3–1.2)
Total Protein: 5.5 g/dL — ABNORMAL LOW (ref 6.5–8.1)

## 2021-03-12 LAB — SAMPLE TO BLOOD BANK

## 2021-03-12 LAB — PREPARE RBC (CROSSMATCH)

## 2021-03-12 NOTE — Telephone Encounter (Signed)
Call returned to Ginger to inform her of results and that per Dr Grayland Ormond he has ordered 1 Unit PRBC for tomorrow

## 2021-03-12 NOTE — Telephone Encounter (Signed)
Daughter called asking for HGB level today, She is scheduled for possible transfusion tomorrow, will she get it?  CBC with Differential/Platelet Order: [id number] Status: Final result    Visible to patient: No (scheduled for 03/12/2021 11:30 AM)    Next appt: 03/13/2021 at 09:00 AM in Oncology ([facility])    Dx: Anemia, unspecified type    0 Result Notes Component Ref Range & Units 10:03  (03/12/21) 7 d ago  (03/05/21) 7 d ago  (03/05/21) 8 d ago  (03/04/21) 9 d ago  (03/03/21) 9 d ago  (03/03/21) 10 d ago  (03/02/21)  WBC 4.0 - 10.5 K/uL 4.5   4.3  4.0  2.7 Low   2.1 Low     RBC 3.87 - 5.11 MIL/uL 2.48 Low    2.72 Low   2.81 Low   2.84 Low   2.73 Low     Hemoglobin 12.0 - 15.0 g/dL 7.2 Low   9.2 Low   7.8 Low   8.1 Low   8.2 Low   7.8 Low   7.3 Low    HCT 36.0 - 46.0 % 23.5 Low   29.1 Low  CM  24.8 Low   25.0 Low   25.1 Low   23.8 Low   23.1 Low  CM   MCV 80.0 - 100.0 fL 94.8   91.2  89.0  88.4  87.2    MCH 26.0 - 34.0 pg 29.0   28.7  28.8  28.9  28.6    MCHC 30.0 - 36.0 g/dL 30.6   31.5  32.4  32.7  32.8    RDW 11.5 - 15.5 % 19.2 High    19.5 High   18.5 High   18.6 High   18.1 High     Platelets 150 - 400 K/uL 131 Low    124 Low   133 Low  CM  110 Low  CM  99 Low  CM    nRBC 0.0 - 0.2 % 0.0   0.0 CM  0.5 High  CM  0.0 CM  1.0 High  CM    Neutrophils Relative % % 77         Neutro Abs 1.7 - 7.7 K/uL 3.5         Lymphocytes Relative % 7         Lymphs Abs 0.7 - 4.0 K/uL 0.3 Low          Monocytes Relative % 11         Monocytes Absolute 0.1 - 1.0 K/uL 0.5         Eosinophils Relative % 4         Eosinophils Absolute 0.0 - 0.5 K/uL 0.2         Basophils Relative % 1         Basophils Absolute 0.0 - 0.1 K/uL 0.0         Immature Granulocytes % 0         Abs Immature Granulocytes 0.00 - 0.07 K/uL 0.02         Comment: Performed at [facility], [address] [place], [place] [address]  Resulting Agency  [facility] CL[facility] LAB [place] [facility] [place] [facility] [place] [facility] [place] [facility]  LAB [place] [facility] [place] [facility]         Specimen Collected: 03/12/21 10:03 Last Resulted: 03/12/21 10:30

## 2021-03-13 ENCOUNTER — Emergency Department: Payer: Medicare Other

## 2021-03-13 ENCOUNTER — Telehealth: Payer: Self-pay | Admitting: Oncology

## 2021-03-13 ENCOUNTER — Other Ambulatory Visit: Payer: Self-pay

## 2021-03-13 ENCOUNTER — Inpatient Hospital Stay: Payer: Medicare Other

## 2021-03-13 ENCOUNTER — Encounter: Payer: Self-pay | Admitting: Family Medicine

## 2021-03-13 ENCOUNTER — Inpatient Hospital Stay
Admission: EM | Admit: 2021-03-13 | Discharge: 2021-03-16 | DRG: 377 | Disposition: A | Discharge status: Hospice | Payer: Medicare Other | Attending: Student | Admitting: Student

## 2021-03-13 DIAGNOSIS — D62 Acute posthemorrhagic anemia: Secondary | ICD-10-CM | POA: Diagnosis not present

## 2021-03-13 DIAGNOSIS — R0989 Other specified symptoms and signs involving the circulatory and respiratory systems: Secondary | ICD-10-CM | POA: Diagnosis present

## 2021-03-13 DIAGNOSIS — K31811 Angiodysplasia of stomach and duodenum with bleeding: Secondary | ICD-10-CM | POA: Diagnosis not present

## 2021-03-13 DIAGNOSIS — N179 Acute kidney failure, unspecified: Secondary | ICD-10-CM | POA: Diagnosis present

## 2021-03-13 DIAGNOSIS — S0003XA Contusion of scalp, initial encounter: Secondary | ICD-10-CM | POA: Diagnosis present

## 2021-03-13 DIAGNOSIS — X19XXXA Contact with other heat and hot substances, initial encounter: Secondary | ICD-10-CM

## 2021-03-13 DIAGNOSIS — Z794 Long term (current) use of insulin: Secondary | ICD-10-CM

## 2021-03-13 DIAGNOSIS — S42309A Unspecified fracture of shaft of humerus, unspecified arm, initial encounter for closed fracture: Secondary | ICD-10-CM

## 2021-03-13 DIAGNOSIS — Z23 Encounter for immunization: Secondary | ICD-10-CM

## 2021-03-13 DIAGNOSIS — D649 Anemia, unspecified: Secondary | ICD-10-CM

## 2021-03-13 DIAGNOSIS — R000 Tachycardia, unspecified: Secondary | ICD-10-CM | POA: Diagnosis present

## 2021-03-13 DIAGNOSIS — K31819 Angiodysplasia of stomach and duodenum without bleeding: Secondary | ICD-10-CM | POA: Diagnosis present

## 2021-03-13 DIAGNOSIS — N1832 Chronic kidney disease, stage 3b: Secondary | ICD-10-CM | POA: Diagnosis present

## 2021-03-13 DIAGNOSIS — S51811A Laceration without foreign body of right forearm, initial encounter: Secondary | ICD-10-CM | POA: Diagnosis present

## 2021-03-13 DIAGNOSIS — M19041 Primary osteoarthritis, right hand: Secondary | ICD-10-CM | POA: Diagnosis present

## 2021-03-13 DIAGNOSIS — K7581 Nonalcoholic steatohepatitis (NASH): Secondary | ICD-10-CM | POA: Diagnosis present

## 2021-03-13 DIAGNOSIS — K3189 Other diseases of stomach and duodenum: Secondary | ICD-10-CM | POA: Diagnosis present

## 2021-03-13 DIAGNOSIS — R9431 Abnormal electrocardiogram [ECG] [EKG]: Secondary | ICD-10-CM | POA: Diagnosis present

## 2021-03-13 DIAGNOSIS — Z8616 Personal history of COVID-19: Secondary | ICD-10-CM

## 2021-03-13 DIAGNOSIS — W19XXXA Unspecified fall, initial encounter: Principal | ICD-10-CM

## 2021-03-13 DIAGNOSIS — Z66 Do not resuscitate: Secondary | ICD-10-CM | POA: Diagnosis present

## 2021-03-13 DIAGNOSIS — I129 Hypertensive chronic kidney disease with stage 1 through stage 4 chronic kidney disease, or unspecified chronic kidney disease: Secondary | ICD-10-CM | POA: Diagnosis present

## 2021-03-13 DIAGNOSIS — K7460 Unspecified cirrhosis of liver: Secondary | ICD-10-CM | POA: Diagnosis not present

## 2021-03-13 DIAGNOSIS — S51812A Laceration without foreign body of left forearm, initial encounter: Secondary | ICD-10-CM | POA: Diagnosis present

## 2021-03-13 DIAGNOSIS — W1839XA Other fall on same level, initial encounter: Secondary | ICD-10-CM | POA: Diagnosis present

## 2021-03-13 DIAGNOSIS — S61512A Laceration without foreign body of left wrist, initial encounter: Secondary | ICD-10-CM | POA: Diagnosis present

## 2021-03-13 DIAGNOSIS — Z87891 Personal history of nicotine dependence: Secondary | ICD-10-CM

## 2021-03-13 DIAGNOSIS — J90 Pleural effusion, not elsewhere classified: Secondary | ICD-10-CM | POA: Diagnosis present

## 2021-03-13 DIAGNOSIS — S0990XA Unspecified injury of head, initial encounter: Secondary | ICD-10-CM

## 2021-03-13 DIAGNOSIS — J811 Chronic pulmonary edema: Secondary | ICD-10-CM | POA: Diagnosis present

## 2021-03-13 DIAGNOSIS — E039 Hypothyroidism, unspecified: Secondary | ICD-10-CM | POA: Diagnosis present

## 2021-03-13 DIAGNOSIS — Z8542 Personal history of malignant neoplasm of other parts of uterus: Secondary | ICD-10-CM

## 2021-03-13 DIAGNOSIS — K922 Gastrointestinal hemorrhage, unspecified: Secondary | ICD-10-CM | POA: Diagnosis present

## 2021-03-13 DIAGNOSIS — R059 Cough, unspecified: Secondary | ICD-10-CM

## 2021-03-13 DIAGNOSIS — S42401A Unspecified fracture of lower end of right humerus, initial encounter for closed fracture: Secondary | ICD-10-CM | POA: Diagnosis present

## 2021-03-13 DIAGNOSIS — K767 Hepatorenal syndrome: Secondary | ICD-10-CM | POA: Diagnosis present

## 2021-03-13 DIAGNOSIS — Y92009 Unspecified place in unspecified non-institutional (private) residence as the place of occurrence of the external cause: Secondary | ICD-10-CM

## 2021-03-13 DIAGNOSIS — Z85828 Personal history of other malignant neoplasm of skin: Secondary | ICD-10-CM

## 2021-03-13 DIAGNOSIS — N183 Chronic kidney disease, stage 3 unspecified: Secondary | ICD-10-CM | POA: Diagnosis present

## 2021-03-13 DIAGNOSIS — Z79890 Hormone replacement therapy (postmenopausal): Secondary | ICD-10-CM

## 2021-03-13 DIAGNOSIS — K766 Portal hypertension: Secondary | ICD-10-CM | POA: Diagnosis present

## 2021-03-13 DIAGNOSIS — Z79899 Other long term (current) drug therapy: Secondary | ICD-10-CM

## 2021-03-13 DIAGNOSIS — E785 Hyperlipidemia, unspecified: Secondary | ICD-10-CM | POA: Diagnosis present

## 2021-03-13 DIAGNOSIS — M85862 Other specified disorders of bone density and structure, left lower leg: Secondary | ICD-10-CM | POA: Diagnosis present

## 2021-03-13 DIAGNOSIS — E1122 Type 2 diabetes mellitus with diabetic chronic kidney disease: Secondary | ICD-10-CM | POA: Diagnosis present

## 2021-03-13 LAB — CBC WITH DIFFERENTIAL/PLATELET
Abs Immature Granulocytes: 0.04 10*3/uL (ref 0.00–0.07)
Basophils Absolute: 0 10*3/uL (ref 0.0–0.1)
Basophils Relative: 0 %
Eosinophils Absolute: 0.2 10*3/uL (ref 0.0–0.5)
Eosinophils Relative: 3 %
HCT: 21.9 % — ABNORMAL LOW (ref 36.0–46.0)
Hemoglobin: 6.4 g/dL — ABNORMAL LOW (ref 12.0–15.0)
Immature Granulocytes: 1 %
Lymphocytes Relative: 7 %
Lymphs Abs: 0.4 10*3/uL — ABNORMAL LOW (ref 0.7–4.0)
MCH: 29.2 pg (ref 26.0–34.0)
MCHC: 29.2 g/dL — ABNORMAL LOW (ref 30.0–36.0)
MCV: 100 fL (ref 80.0–100.0)
Monocytes Absolute: 0.6 10*3/uL (ref 0.1–1.0)
Monocytes Relative: 11 %
Neutro Abs: 4.1 10*3/uL (ref 1.7–7.7)
Neutrophils Relative %: 78 %
Platelets: 150 10*3/uL (ref 150–400)
RBC: 2.19 MIL/uL — ABNORMAL LOW (ref 3.87–5.11)
RDW: 19 % — ABNORMAL HIGH (ref 11.5–15.5)
WBC: 5.4 10*3/uL (ref 4.0–10.5)
nRBC: 0 % (ref 0.0–0.2)

## 2021-03-13 LAB — COMPREHENSIVE METABOLIC PANEL
ALT: 10 U/L (ref 0–44)
AST: 24 U/L (ref 15–41)
Albumin: 1.9 g/dL — ABNORMAL LOW (ref 3.5–5.0)
Alkaline Phosphatase: 48 U/L (ref 38–126)
Anion gap: 10 (ref 5–15)
BUN: 38 mg/dL — ABNORMAL HIGH (ref 8–23)
CO2: 21 mmol/L — ABNORMAL LOW (ref 22–32)
Calcium: 8 mg/dL — ABNORMAL LOW (ref 8.9–10.3)
Chloride: 104 mmol/L (ref 98–111)
Creatinine, Ser: 1.42 mg/dL — ABNORMAL HIGH (ref 0.44–1.00)
GFR, Estimated: 38 mL/min — ABNORMAL LOW (ref >60)
Glucose, Bld: 116 mg/dL — ABNORMAL HIGH (ref 70–99)
Potassium: 4.5 mmol/L (ref 3.5–5.1)
Sodium: 135 mmol/L (ref 135–145)
Total Bilirubin: 1.3 mg/dL — ABNORMAL HIGH (ref 0.3–1.2)
Total Protein: 5 g/dL — ABNORMAL LOW (ref 6.5–8.1)

## 2021-03-13 LAB — TYPE AND SCREEN
ABO/RH(D): A POS
Antibody Screen: NEGATIVE
Unit division: 0

## 2021-03-13 LAB — RESP PANEL BY RT-PCR (FLU A&B, COVID) ARPGX2
Influenza A by PCR: NEGATIVE
Influenza B by PCR: NEGATIVE
SARS Coronavirus 2 by RT PCR: POSITIVE — AB

## 2021-03-13 LAB — PROTIME-INR
INR: 1.1 (ref 0.8–1.2)
Prothrombin Time: 14.6 seconds (ref 11.4–15.2)

## 2021-03-13 LAB — BPAM RBC
Blood Product Expiration Date: 202212282359
Unit Type and Rh: 6200

## 2021-03-13 LAB — HEMOGLOBIN AND HEMATOCRIT, BLOOD
HCT: 24.5 % — ABNORMAL LOW (ref 36.0–46.0)
Hemoglobin: 7.9 g/dL — ABNORMAL LOW (ref 12.0–15.0)

## 2021-03-13 LAB — PREPARE RBC (CROSSMATCH)

## 2021-03-13 LAB — CBG MONITORING, ED
Glucose-Capillary: 189 mg/dL — ABNORMAL HIGH (ref 70–99)
Glucose-Capillary: 145 mg/dL — ABNORMAL HIGH (ref 70–99)

## 2021-03-13 LAB — MAGNESIUM: Magnesium: 1.8 mg/dL (ref 1.7–2.4)

## 2021-03-13 LAB — GLUCOSE, CAPILLARY: Glucose-Capillary: 145 mg/dL — ABNORMAL HIGH (ref 70–99)

## 2021-03-13 LAB — TROPONIN I (HIGH SENSITIVITY): Troponin I (High Sensitivity): 3 ng/L (ref <18)

## 2021-03-13 LAB — BRAIN NATRIURETIC PEPTIDE: B Natriuretic Peptide: 130.7 pg/mL — ABNORMAL HIGH (ref 0.0–100.0)

## 2021-03-13 MED ORDER — PANTOPRAZOLE SODIUM 40 MG IV SOLR
40.0000 mg | Freq: Two times a day (BID) | INTRAVENOUS | Status: DC
  Administered 2021-03-13 – 2021-03-16 (×7): 40 mg via INTRAVENOUS
  Filled 2021-03-13 (×7): qty 40

## 2021-03-13 MED ORDER — TETANUS-DIPHTH-ACELL PERTUSSIS 5-2.5-18.5 LF-MCG/0.5 IM SUSY
0.5000 mL | PREFILLED_SYRINGE | Freq: Once | INTRAMUSCULAR | Status: AC
  Administered 2021-03-13: 0.5 mL via INTRAMUSCULAR
  Filled 2021-03-13: qty 0.5

## 2021-03-13 MED ORDER — INSULIN GLARGINE-YFGN 100 UNIT/ML ~~LOC~~ SOLN
20.0000 [IU] | Freq: Every day | SUBCUTANEOUS | Status: DC
  Administered 2021-03-14 – 2021-03-15 (×3): 20 [IU] via SUBCUTANEOUS
  Filled 2021-03-13 (×4): qty 0.2

## 2021-03-13 MED ORDER — MORPHINE SULFATE (PF) 2 MG/ML IV SOLN
1.0000 mg | INTRAVENOUS | Status: DC | PRN
  Administered 2021-03-13 – 2021-03-16 (×6): 1 mg via INTRAVENOUS
  Filled 2021-03-13 (×6): qty 1

## 2021-03-13 MED ORDER — PANTOPRAZOLE SODIUM 40 MG IV SOLR: 40.0000 mg | INTRAVENOUS | Status: DC

## 2021-03-13 MED ORDER — BACITRACIN ZINC 500 UNIT/GM EX OINT
TOPICAL_OINTMENT | Freq: Once | CUTANEOUS | Status: AC
  Administered 2021-03-13: 2 via TOPICAL
  Filled 2021-03-13: qty 1.8

## 2021-03-13 MED ORDER — DICLOFENAC SODIUM 1 % EX GEL
2.0000 g | Freq: Four times a day (QID) | CUTANEOUS | Status: DC | PRN
  Filled 2021-03-13: qty 100

## 2021-03-13 MED ORDER — VITAMIN B-12 1000 MCG PO TABS
1000.0000 ug | ORAL_TABLET | Freq: Every day | ORAL | Status: DC
  Administered 2021-03-13 – 2021-03-16 (×4): 1000 ug via ORAL
  Filled 2021-03-13 (×4): qty 1

## 2021-03-13 MED ORDER — FUROSEMIDE 40 MG PO TABS
40.0000 mg | ORAL_TABLET | Freq: Every day | ORAL | Status: DC
  Administered 2021-03-13 – 2021-03-15 (×3): 40 mg via ORAL
  Filled 2021-03-13 (×3): qty 1

## 2021-03-13 MED ORDER — LEVOTHYROXINE SODIUM 50 MCG PO TABS
75.0000 ug | ORAL_TABLET | Freq: Every day | ORAL | Status: DC
  Administered 2021-03-13 – 2021-03-16 (×4): 75 ug via ORAL
  Filled 2021-03-13 (×4): qty 2

## 2021-03-13 MED ORDER — SIMVASTATIN 20 MG PO TABS
20.0000 mg | ORAL_TABLET | Freq: Every day | ORAL | Status: DC
  Administered 2021-03-13 – 2021-03-15 (×3): 20 mg via ORAL
  Filled 2021-03-13 (×4): qty 1

## 2021-03-13 MED ORDER — IPRATROPIUM-ALBUTEROL 0.5-2.5 (3) MG/3ML IN SOLN
3.0000 mL | Freq: Four times a day (QID) | RESPIRATORY_TRACT | Status: DC | PRN
  Administered 2021-03-13 – 2021-03-16 (×4): 3 mL via RESPIRATORY_TRACT
  Filled 2021-03-13 (×4): qty 3

## 2021-03-13 MED ORDER — FENTANYL CITRATE PF 50 MCG/ML IJ SOSY
50.0000 ug | PREFILLED_SYRINGE | Freq: Once | INTRAMUSCULAR | Status: AC
  Administered 2021-03-13: 50 ug via INTRAVENOUS
  Filled 2021-03-13: qty 1

## 2021-03-13 MED ORDER — SODIUM CHLORIDE 0.9 % IV SOLN
10.0000 mL/h | Freq: Once | INTRAVENOUS | Status: AC
  Administered 2021-03-13: 10 mL/h via INTRAVENOUS

## 2021-03-13 MED ORDER — ACETAMINOPHEN 500 MG PO TABS
1000.0000 mg | ORAL_TABLET | Freq: Four times a day (QID) | ORAL | Status: DC | PRN
  Administered 2021-03-15 (×2): 1000 mg via ORAL
  Filled 2021-03-13 (×2): qty 2

## 2021-03-13 MED ORDER — INSULIN ASPART 100 UNIT/ML IJ SOLN
0.0000 [IU] | Freq: Three times a day (TID) | INTRAMUSCULAR | Status: DC
  Administered 2021-03-13: 2 [IU] via SUBCUTANEOUS
  Administered 2021-03-13 – 2021-03-14 (×3): 3 [IU] via SUBCUTANEOUS
  Administered 2021-03-15 (×2): 5 [IU] via SUBCUTANEOUS
  Administered 2021-03-15 – 2021-03-16 (×2): 3 [IU] via SUBCUTANEOUS
  Filled 2021-03-13 (×8): qty 1

## 2021-03-13 MED ORDER — FERROUS SULFATE 325 (65 FE) MG PO TABS
325.0000 mg | ORAL_TABLET | Freq: Two times a day (BID) | ORAL | Status: DC
  Administered 2021-03-13 – 2021-03-16 (×7): 325 mg via ORAL
  Filled 2021-03-13 (×7): qty 1

## 2021-03-13 MED ORDER — ONDANSETRON HCL 4 MG/2ML IJ SOLN
4.0000 mg | Freq: Once | INTRAMUSCULAR | Status: AC
  Administered 2021-03-13: 4 mg via INTRAVENOUS
  Filled 2021-03-13: qty 2

## 2021-03-13 MED ORDER — METHOCARBAMOL 1000 MG/10ML IJ SOLN
500.0000 mg | Freq: Four times a day (QID) | INTRAVENOUS | Status: DC | PRN
  Filled 2021-03-13: qty 5

## 2021-03-13 MED ORDER — SPIRONOLACTONE 25 MG PO TABS
100.0000 mg | ORAL_TABLET | Freq: Every day | ORAL | Status: DC
  Administered 2021-03-13 – 2021-03-14 (×2): 100 mg via ORAL
  Filled 2021-03-13 (×2): qty 4

## 2021-03-13 NOTE — Progress Notes (Signed)
Called by emergency department staff regarding patient's radiographic findings of acute right distal humerus fracture with chronic prior humeral shaft fracture that is healed.  1.  Recommend nonweightbearing on right upper extremity with splint placement and sling for comfort.  2.  Patient may eventually benefit from surgery for the right elbow, but would likely need an elbow specialist given the fracture pattern.  This can be performed as an outpatient.  3.  PT/OT for mobilization when medically ready.  4.  Full consult note to follow.

## 2021-03-13 NOTE — ED Provider Notes (Signed)
[facility] Emergency Department Provider Note ____________________________________________   Event Date/Time   First MD Initiated Contact with Patient 03/13/21 [phone]     (approximate)  I have reviewed the triage vital signs and the nursing notes.   HISTORY  Chief Complaint Fall    HPI April Blake is a 80 y.o. 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  States she fell trying to go to the bathroom.  Patient states she is not sure why she fell.  She reports she fell forward onto her knees and injured her right upper extremity and hit her forehead on a piece of furniture.  No loss of consciousness.  Not on blood thinners.  Has a skin tear to the left forearm.  Unsure of her last tetanus vaccine.  Patient lives at home with her daughter.  Uses a walker to ambulate at baseline.  She denies any chest pain, palpitations, dizziness, shortness of breath that led to her fall.  No recent fever, cough, vomiting or diarrhea.  Unsure of her last tetanus vaccination.  On review of patient's records, it appears she was just admitted to the hospital on 03/01/2021 -03/05/2021 for symptomatic anemia and had a hemoglobin of 5 and was transfused 4 units of packed red blood cells.  She is followed by hematology here at [facility] and receives blood transfusions every 2 to 3 months and daughter reports was scheduled to have a blood transfusion today.         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   Hyperlipidemia associated with type 2 diabetes mellitus ([place]) 09/30/2020   Impaired mobility 09/30/2020   Insomnia 09/30/2020   Osteoarthritis of knee 09/30/2020   Personal history of colonic polyps 09/30/2020   Chronic blood loss anemia 09/27/2020   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   Uterine cancer ([place]) 04/09/2020   Upper GI bleed 03/16/2018   Bilateral lower extremity edema 02/09/2018   Coronary artery calcification seen on CT scan 12/01/2017   Exertional dyspnea 12/01/2017   Ascites 10/24/2013   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   Ventral hernia 09/27/2013   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   Hypothyroidism 08/16/2013   Essential hypertension 08/16/2013    Past Surgical History:  Procedure Laterality Date   CATARACT EXTRACTION     cataract surg     ESOPHAGOGASTRODUODENOSCOPY (EGD) WITH PROPOFOL N/A 03/03/2021   Procedure: ESOPHAGOGASTRODUODENOSCOPY (EGD) WITH PROPOFOL;  Surgeon: Lin Landsman, MD;  Location: [facility] ENDOSCOPY;  Service: Gastroenterology;  Laterality: N/A;   FLEXIBLE SIGMOIDOSCOPY N/A 03/03/2021   Procedure: FLEXIBLE SIGMOIDOSCOPY;  Surgeon: Lin Landsman, MD;  Location: [facility] ENDOSCOPY;  Service: Gastroenterology;  Laterality: N/A;   SKIN CANCER EXCISION     skin cancer removal     surgery  to left leg      Prior to Admission medications   Medication Sig Start Date End Date Taking? Authorizing Provider  acetaminophen (TYLENOL) 500 MG tablet Take 1,000 mg by mouth every 4 (four) hours as needed for mild pain.    [provider]  albuterol (VENTOLIN HFA) 108 (90 Base) MCG/ACT inhaler  09/22/17   [provider]  diclofenac Sodium (VOLTAREN) 1 % GEL Apply 2 g topically 4 (four) times daily as needed (osteoarthritis knee pain).  10/14/20   Karamalegos, Devonne Doughty, DO  ferrous sulfate 325 (65 FE) MG tablet Take 1 tablet (325 mg total) by mouth 2 (two) times daily with a meal. 03/05/21   Lorella Nimrod, MD  furosemide (LASIX) 20 MG tablet Take 2 tablets (40 mg total) by mouth daily. 03/05/21   Lorella Nimrod, MD  insulin glargine (LANTUS SOLOSTAR) 100 UNIT/ML Solostar Pen 20 units in the morning 03/18/18   [provider]  insulin regular (NOVOLIN R) 100 units/mL injection Inject 6-12 Units into the skin 2 (two) times daily before a meal. 06/27/17   [provider]  ipratropium-albuterol (DUONEB) 0.5-2.5 (3) MG/3ML SOLN Take 3 mLs by nebulization 4 (four) times daily as needed. 01/31/21   [provider]  levothyroxine (SYNTHROID) 75 MCG tablet 1 tablet in the morning on an empty stomach    [provider]  omeprazole (PRILOSEC) 40 MG capsule Take 1 capsule (40 mg total) by mouth 2 (two) times daily before a meal. 01/15/21   Karamalegos, Devonne Doughty, DO  ondansetron (ZOFRAN) 4 MG tablet  04/02/20   [provider]  polyethylene glycol powder (GLYCOLAX/MIRALAX) 17 GM/SCOOP powder Take by mouth. Patient not taking: Reported on 03/02/2021    [provider]  propranolol (INDERAL) 10 MG tablet Take 10 mg by mouth 2 (two) times daily. 08/15/17   [provider]  simvastatin (ZOCOR) 20 MG tablet Take 1 tablet (20 mg total) by mouth at bedtime. 10/14/20   Karamalegos, Devonne Doughty, DO  spironolactone (ALDACTONE) 50 MG tablet Take 100 mg by mouth daily. 07/03/20   [provider]  vitamin B-12 (CYANOCOBALAMIN) 1000 MCG tablet Take 1 tablet (1,000 mcg total) by mouth daily. 03/05/21   Lorella Nimrod, MD    Allergies Patient has no known allergies.  History reviewed. No pertinent family history.  Social History Social History   Tobacco Use   Smoking status: Former    Packs/day: 0.20    Years: 5.00    Pack years: 1.00    Types: Cigarettes   Smokeless tobacco:  Never  Substance Use Topics   Alcohol use: Never   Drug use: Never    Review of Systems Constitutional: No fever. Eyes: No visual changes. ENT: No sore throat. Cardiovascular: Denies chest pain. Respiratory: Denies shortness of breath. Gastrointestinal: No nausea, vomiting, diarrhea. Genitourinary: Negative for dysuria. Musculoskeletal: Negative for back pain. Skin: Negative for rash. Neurological: Negative for focal weakness or numbness.   ____________________________________________   PHYSICAL EXAM:  VITAL SIGNS: ED Triage Vitals  Enc Vitals Group     BP 03/13/21 0524 (!) 114/48     Pulse Rate 03/13/21 0524 70     Resp 03/13/21 0524 18     Temp 03/13/21 0524 97.8 F (36.6 C)     Temp Source 03/13/21 0524 Oral     SpO2 03/13/21 0524 97 %     Weight 03/13/21 0522 205 lb 0.4 oz (93 kg)     Height 03/13/21 0522 5\' 3"  (1.6  m)     Head Circumference --      Peak Flow --      Pain Score 03/13/21 0522 10     Pain Loc --      Pain Edu? --      Excl. in [place]? --    CONSTITUTIONAL: Alert and oriented and responds appropriately to questions.  Elderly, morbidly obese, appears uncomfortable HEAD: Normocephalic; large hematoma and abrasion to the center of her forehead EYES: Conjunctivae clear, PERRL, EOMI ENT: normal nose; no rhinorrhea; moist mucous membranes; pharynx without lesions noted; no dental injury; no septal hematoma NECK: Supple, no meningismus, no LAD; no midline spinal tenderness, step-off or deformity; trachea midline CARD: RRR; S1 and S2 appreciated; no murmurs, no clicks, no rubs, no gallops RESP: Normal chest excursion without splinting or tachypnea; breath sounds clear and equal bilaterally; no wheezes, no rhonchi, no rales; no hypoxia or respiratory distress CHEST:  chest wall stable, no crepitus or ecchymosis or deformity, nontender to palpation; no flail chest ABD/GI: Normal bowel sounds; non-distended; soft, non-tender, no rebound, no guarding; no  ecchymosis or other lesions noted PELVIS:  stable, nontender to palpation BACK:  The back appears normal and is non-tender to palpation, there is no CVA tenderness; no midline spinal tenderness, step-off or deformity EXT: Skin tear to the left forearm.  No underlying bony tenderness.  Patient is tender to palpation over the right dorsal hand with soft tissue swelling and ecchymosis.  She also has tenderness to palpation from the proximal right forearm to the proximal right humerus.  Difficult to assess for bony abnormality or swelling due to body habitus.  Also has tenderness to palpation of her bilateral knees with associated ecchymosis.  Compartments soft.  Extremities seem warm and well-perfused. SKIN: Normal color for age and race; warm NEURO: Moves all extremities equally, no facial asymmetry, normal speech PSYCH: The patient's mood and manner are appropriate. Grooming and personal hygiene are appropriate.  ____________________________________________   LABS (all labs ordered are listed, but only abnormal results are displayed)  Labs Reviewed  CBC WITH DIFFERENTIAL/PLATELET - Abnormal; Notable for the following components:      Result Value   RBC 2.19 (*)    Hemoglobin 6.4 (*)    HCT 21.9 (*)    MCHC 29.2 (*)    RDW 19.0 (*)    Lymphs Abs 0.4 (*)    All other components within normal limits  COMPREHENSIVE METABOLIC PANEL - Abnormal; Notable for the following components:   CO2 21 (*)    Glucose, Bld 116 (*)    BUN 38 (*)    Creatinine, Ser 1.42 (*)    Calcium 8.0 (*)    Total Protein 5.0 (*)    Albumin 1.9 (*)    Total Bilirubin 1.3 (*)    GFR, Estimated 38 (*)    All other components within normal limits  URINALYSIS, COMPLETE (UACMP) WITH MICROSCOPIC  PROTIME-INR  MAGNESIUM  PREPARE RBC (CROSSMATCH)  TYPE AND SCREEN   ____________________________________________  EKG   Date: 03/13/2021 7:43 AM   Rate: 74  Rhythm: normal sinus rhythm  QRS Axis: normal  Intervals:  Prolonged QT interval, QTC is 603 ms  ST/T Wave abnormalities: Nonspecific T wave abnormality seen diffusely  Conduction Disutrbances: none  Narrative Interpretation: Nonspecific T wave abnormality similar compared to previous EKG, prolonged QT interval which is new    ____________________________________________  RADIOLOGY I, Eliel Dudding, personally viewed and evaluated these images (plain radiographs) as part of my medical  decision making, as well as reviewing the written report by the radiologist.  ED MD interpretation: X-rays show acute distal right humerus fracture.  She has what appears to be a chronic mid humerus fracture on the right.  CT head and cervical spine show forehead hematoma without other acute abnormality.  Official radiology report(s): DG Elbow 2 Views Right  Result Date: 03/13/2021 CLINICAL DATA:  80 year old female status post witnessed fall. EXAM: RIGHT ELBOW - 2 VIEW COMPARISON:  Right humerus series today. FINDINGS: Abnormal impaction and fragmentation of the distal right humerus involving the trochlea and olecranon fossa. Underlying chronic midshaft humerus fracture noted, but the distal metadiaphysis bone margins appear un healed, acute. However, there is relatively maintained alignment of the proximal radius and ulna which appear to be intact. IMPRESSION: Impacted fracture of the distal right humerus involving the trochlea and olecranon fossa. Burtis Junes this is acute, although a chronic midshaft humerus fracture is noted on the comparison. Relatively maintained alignment of the proximal radius and ulna which appear intact. Electronically Signed   By: Genevie Ann M.D.   On: 03/13/2021 07:34   DG Forearm Right  Result Date: 03/13/2021 CLINICAL DATA:  80 year old female status post witnessed fall. EXAM: RIGHT FOREARM - 2 VIEW COMPARISON:  Right elbow series today. FINDINGS: Abnormal distal right humerus reported separately. The right radius and ulna appear to remain intact.  Chronic ulnar styloid fracture. Relatively maintained alignment at the right wrist. Calcified peripheral vascular disease. No discrete soft tissue injury. IMPRESSION: 1. Abnormal distal right humerus reported separately. 2. No acute fracture or dislocation identified in the right forearm. Electronically Signed   By: Genevie Ann M.D.   On: 03/13/2021 07:35   CT HEAD WO CONTRAST (5MM)  Result Date: 03/13/2021 CLINICAL DATA:  80 year old female status post witnessed fall. EXAM: CT HEAD WITHOUT CONTRAST TECHNIQUE: Contiguous axial images were obtained from the base of the skull through the vertex without intravenous contrast. COMPARISON:  Head CT and Brain MRI 03/01/2021. FINDINGS: Brain: Stable cerebral volume. No midline shift, ventriculomegaly, mass effect, evidence of mass lesion, intracranial hemorrhage or evidence of cortically based acute infarction. Patchy and confluent bilateral white matter hypodensity appears stable from last month. No cortical encephalomalacia identified. Vascular: Extensive Calcified atherosclerosis at the skull base. No suspicious intracranial vascular hyperdensity. Skull: Stable.  No acute osseous abnormality identified. Sinuses/Orbits: Left maxillary sinus mucous retention cyst is stable. Other Visualized paranasal sinuses and mastoids are stable and well aerated. Other: Broad-based forehead scalp hematoma. No scalp soft tissue gas. Underlying frontal bones appear stable and intact. Orbits soft tissues appears stable. IMPRESSION: 1. Broad-based forehead scalp hematoma without underlying skull fracture. 2. Stable from last month non contrast CT appearance of advanced cerebral white matter changes. Electronically Signed   By: Genevie Ann M.D.   On: 03/13/2021 07:24   CT Cervical Spine Wo Contrast  Result Date: 03/13/2021 CLINICAL DATA:  80 year old female status post witnessed fall. EXAM: CT CERVICAL SPINE WITHOUT CONTRAST TECHNIQUE: Multidetector CT imaging of the cervical spine was  performed without intravenous contrast. Multiplanar CT image reconstructions were also generated. COMPARISON:  Brain MRI and chest radiographs 03/01/2021. FINDINGS: Alignment: Straightening of cervical lordosis. Bilateral posterior element alignment is within normal limits. Cervicothoracic junction alignment is within normal limits. Skull base and vertebrae: Visualized skull base is intact. No atlanto-occipital dissociation. C1 and C2 appear intact and aligned. No acute osseous abnormality identified. Soft tissues and spinal canal: No prevertebral fluid or swelling. No visible canal hematoma. Negative noncontrast visible neck  soft tissues. Disc levels:  Mild for age cervical spine degeneration. Upper chest: At least moderate layering left pleural effusion, questionable mildly complex fluid density. A small left pleural effusion was present last month. No left pneumothorax or apical lung contusion. Grossly intact visible upper thoracic levels. Other: Absent and carious dentition. IMPRESSION: 1. No acute traumatic injury identified in the cervical spine. 2. Layering pleural effusion in the left lung apex appears to be at least moderate in volume. Recommend follow-up Chest imaging. Electronically Signed   By: Genevie Ann M.D.   On: 03/13/2021 07:28   DG Knee Complete 4 Views Left  Result Date: 03/13/2021 CLINICAL DATA:  80 year old female status post witnessed fall. EXAM: LEFT KNEE - COMPLETE 4+ VIEW COMPARISON:  None. FINDINGS: Osteopenia. Severe lateral compartment joint space loss. Bulky tricompartmental degenerative spurring. Orthopedic staple hardware at the medial tibial metadiaphysis. No definite joint effusion. Patella appears intact. No acute osseous abnormality identified. Generalized subcutaneous soft tissue stranding. IMPRESSION: No acute fracture or dislocation identified about the left knee. Osteopenia with advanced degeneration, severe in the lateral compartment. Postoperative changes to the medial  compartment. Electronically Signed   By: Genevie Ann M.D.   On: 03/13/2021 07:31   DG Knee Complete 4 Views Right  Result Date: 03/13/2021 CLINICAL DATA:  80 year old female status post witnessed fall. EXAM: RIGHT KNEE - COMPLETE 4+ VIEW COMPARISON:  None. FINDINGS: Osteopenia. Moderate medial compartment joint space loss. Mild to moderate tricompartmental degenerative spurring. Patella appears intact. No joint effusion identified. No acute osseous abnormality identified. Calcified peripheral vascular disease. Generalized subcutaneous stranding. IMPRESSION: 1. Osteopenia. No acute fracture or dislocation identified about the right knee. 2. Medial compartment dominant degenerative changes. 3. Calcified peripheral vascular disease. Electronically Signed   By: Genevie Ann M.D.   On: 03/13/2021 07:30   DG Humerus Right  Result Date: 03/13/2021 CLINICAL DATA:  80 year old female status post witnessed fall. EXAM: RIGHT HUMERUS - 2+ VIEW COMPARISON:  None. FINDINGS: Grossly preserved alignment at the right shoulder. Angulated, healed right humeral shaft fracture with midshaft callus. The distal humerus at the elbow appears abnormal. See dedicated elbow series. Visible right ribs appear intact. IMPRESSION: 1. Distal right humerus appears abnormal. See dedicated Right Elbow Series. 2. Chronic right midshaft humerus fracture. Maintained alignment at the right shoulder. Electronically Signed   By: Genevie Ann M.D.   On: 03/13/2021 07:32   DG Hand Complete Right  Result Date: 03/13/2021 CLINICAL DATA:  80 year old female status post witnessed fall. EXAM: RIGHT HAND - COMPLETE 3+ VIEW COMPARISON:  Right forearm series today. FINDINGS: Chronic ulnar styloid fracture. Distal radius and ulna otherwise appear intact. Carpal bone alignment and joint spaces maintained. Metacarpals appear intact. Widespread IP joint space loss and osteophytosis in keeping with osteoarthritis. No acute osseous abnormality identified. IMPRESSION: 1. No  acute fracture or dislocation identified about the right hand. 2. Osteoarthritis.  Chronic ulnar styloid fracture. Electronically Signed   By: Genevie Ann M.D.   On: 03/13/2021 07:37    ____________________________________________   PROCEDURES  Procedure(s) performed (including Critical Care):  Procedures  CRITICAL CARE Performed by: Cyril Mourning Cyniah Gossard   Total critical care time: 45  Minutes  Critical care time was exclusive of separately billable procedures and treating other patients.  Critical care was necessary to treat or prevent imminent or life-threatening deterioration.  Critical care was time spent personally by me on the following activities: development of treatment plan with patient and/or surrogate as well as nursing, discussions with consultants, evaluation of  patient's response to treatment, examination of patient, obtaining history from patient or surrogate, ordering and performing treatments and interventions, ordering and review of laboratory studies, ordering and review of radiographic studies, pulse oximetry and re-evaluation of patient's condition.   SPLINT APPLICATION Date/Time: 0:24 AM Authorized by: Cyril Mourning Vannia Pola Consent: Verbal consent obtained. Risks and benefits: risks, benefits and alternatives were discussed Consent given by: patient Splint applied by: technician Location details: Posterior splint left upper extremity Splint type: Posterior splint Supplies used: Fiberglass Post-procedure: The splinted body part was neurovascularly unchanged following the procedure. Patient tolerance: Patient tolerated the procedure well with no immediate complications.   ____________________________________________   INITIAL IMPRESSION / ASSESSMENT AND PLAN / ED COURSE  As part of my medical decision making, I reviewed the following data within the [place] History obtained from family, Nursing notes reviewed and incorporated, Labs reviewed , EKG  interpreted , Old EKG reviewed, Old chart reviewed, Radiograph reviewed , Discussed with admitting physician , CT reviewed, and Notes from prior ED visits         Patient here with a fall from home.  States she is unsure why she fell.  Denies any preceding chest pain, shortness of breath, dizziness.  Denies any recent infectious symptoms.  Was scheduled to have a blood transfusion later today.  Could be secondary to symptomatic anemia.  We will also check for electrolyte derangement, UTI.  Will obtain CT of her head and cervical spine and x-rays of her extremities.  Will give pain medication.  Will obtain EKG to evaluate for ischemia, interval abnormality, arrhythmia.   We will clean and dress the wound to her left forearm and update her tetanus vaccine.  ED PROGRESS  Patient's blood pressure has been dropping slowly.  Her hemoglobin has come back at 6.4.  This is secondary to chronic blood loss.  Will give 2 units of packed red blood cells.  I feel patient will need admission especially given she is symptomatic from this with mild hypotension.  CT of her head and cervical spine show no acute traumatic injury but does demonstrate a layering left lung effusion.  Will obtain chest x-ray.  X-ray of the right upper extremity shows possible acute distal humerus fracture.  Discussed case with Dr. Posey Pronto who recommends posterior splint and sling.  He will see patient in consultation.  Other imaging shows no acute abnormality.  CT of the cervical spine does show a layering effusion.  Will obtain dedicated chest x-ray.  EKG shows a prolonged QT interval which was not present on her most recent EKG but no new ischemic change.  She did receive Zofran already but will avoid any other medications that may worsen her QT interval.  She is on a cardiac monitor.  Her potassium level is normal.  We will add on a magnesium level.  No arrhythmias noted in the ED currently.  Will discuss with medicine for  admission.  8:09 AM Discussed patient's case with hospitalist, Dr. Francine Graven.  I have recommended admission and patient (and family if present) agree with this plan. Admitting physician will place admission orders.   I reviewed all nursing notes, vitals, pertinent previous records and reviewed/interpreted all EKGs, lab and urine results, imaging (as available).  ____________________________________________   FINAL CLINICAL IMPRESSION(S) / ED DIAGNOSES  Final diagnoses:  Fall, initial encounter  Injury of head, initial encounter  Skin tear of right forearm without complication, initial encounter  Symptomatic anemia  Closed fracture of distal end of right  humerus, unspecified fracture morphology, initial encounter  Prolonged Q-T interval on ECG     ED Discharge Orders     None       *Please note:  April Blake was evaluated in Emergency Department on 03/13/2021 for the symptoms described in the history of present illness. She was evaluated in the context of the global COVID-19 pandemic, which necessitated consideration that the patient might be at risk for infection with the SARS-CoV-2 virus that causes COVID-19. Institutional protocols and algorithms that pertain to the evaluation of patients at risk for COVID-19 are in a state of rapid change based on information released by regulatory bodies including the CDC and federal and state organizations. These policies and algorithms were followed during the patient's care in the ED.  Some ED evaluations and interventions may be delayed as a result of limited staffing during and the pandemic.*   Note:  This document was prepared using Dragon voice recognition software and may include unintentional dictation errors.    Kailash Hinze, Delice Bison, DO 03/13/21 [id number]

## 2021-03-13 NOTE — ED Notes (Signed)
Awaken to voice, Placed on Cardiac monitor. Noted brusing at frontal lobe with skin tear. Noted skin tear left bicep area. Patient states pain in right elbow. 12 Lead ECG completed and given to EDP.

## 2021-03-13 NOTE — Consult Note (Signed)
ORTHOPAEDIC CONSULTATION  REQUESTING PHYSICIAN: Collier Bullock, MD  Chief Complaint:   R elbow pain  History of Present Illness: April Blake is a 80 y.o. female right-hand dominant who had a fall onto her right elbow after feeling dizzy.  Radiographs in the emergency department showed a distal humerus fracture with prior healed humeral shaft fracture on the right side.  Additionally work-up revealed significant symptomatic anemia.  Patient has a history of multiple regularly scheduled blood transfusions.  She lives at home with her daughter and grandchildren.  She uses a walker for ambulation.  She also has a history of chronic kidney disease, cirrhosis and hypothyroidism.  She did test positive for COVID on admission.  However, the patient states that she was diagnosed with COVID approximately 1 month ago and was symptomatic at that time.  Past Medical History:  Diagnosis Date   Anemia    Heart disease    Hyperlipidemia    Hypertension    Liver disease    Thyroid disease    Uterine cancer ([facility])    Past Surgical History:  Procedure Laterality Date   CATARACT EXTRACTION     cataract surg     ESOPHAGOGASTRODUODENOSCOPY (EGD) WITH PROPOFOL N/A 03/03/2021   Procedure: ESOPHAGOGASTRODUODENOSCOPY (EGD) WITH PROPOFOL;  Surgeon: Lin Landsman, MD;  Location: [facility] ENDOSCOPY;  Service: Gastroenterology;  Laterality: N/A;   FLEXIBLE SIGMOIDOSCOPY N/A 03/03/2021   Procedure: FLEXIBLE SIGMOIDOSCOPY;  Surgeon: Lin Landsman, MD;  Location: [facility] ENDOSCOPY;  Service: Gastroenterology;  Laterality: N/A;   SKIN CANCER EXCISION     skin cancer removal     surgery to left leg     Social History   Socioeconomic History   Marital status: Widowed    Spouse name: Not on file   Number of children: Not on file   Years of education: Not on file   Highest education level: Not on file  Occupational History   Not on file   Tobacco Use   Smoking status: Former    Packs/day: 0.20    Years: 5.00    Pack years: 1.00    Types: Cigarettes   Smokeless tobacco: Never  Substance and Sexual Activity   Alcohol use: Never   Drug use: Never   Sexual activity: Not on file  Other Topics Concern   Not on file  Social History Narrative   Not on file   Social Determinants of Health   Financial Resource Strain: Not on file  Food Insecurity: Not on file  Transportation Needs: Not on file  Physical Activity: Not on file  Stress: Not on file  Social Connections: Not on file   History reviewed. No pertinent family history. No Known Allergies Prior to Admission medications   Medication Sig Start Date End Date Taking? Authorizing Provider  acetaminophen (TYLENOL) 500 MG tablet Take 1,000 mg by mouth every 4 (four) hours as needed for mild pain.   Yes [provider]  diclofenac Sodium (VOLTAREN) 1 % GEL Apply 2 g topically 4 (four) times daily as needed (osteoarthritis knee pain). 10/14/20  Yes Karamalegos, Devonne Doughty, DO  ferrous sulfate 325 (65 FE) MG tablet Take 1 tablet (325 mg total) by mouth 2 (two) times daily with a meal. 03/05/21  Yes Lorella Nimrod, MD  furosemide (LASIX) 20 MG tablet Take 2 tablets (40 mg total) by mouth daily. 03/05/21  Yes Lorella Nimrod, MD  insulin glargine (LANTUS SOLOSTAR) 100 UNIT/ML Solostar Pen Inject 16 Units into the skin daily.   Yes  [provider]  insulin regular (NOVOLIN R) 100 units/mL injection Inject 2-12 Units into the skin 2 (two) times daily before a meal. (Based upon blood glucose result)   Yes [provider]  ipratropium-albuterol (DUONEB) 0.5-2.5 (3) MG/3ML SOLN Take 3 mLs by nebulization 4 (four) times daily as needed (shortness of breath/wheexing).   Yes [provider]  levothyroxine (SYNTHROID) 75 MCG tablet 75 mcg.   Yes [provider]  omeprazole (PRILOSEC) 40 MG capsule Take 1 capsule (40 mg total) by mouth 2 (two)  times daily before a meal. 01/15/21  Yes Karamalegos, Devonne Doughty, DO  propranolol (INDERAL) 10 MG tablet Take 10 mg by mouth 2 (two) times daily. 08/15/17  Yes [provider]  simvastatin (ZOCOR) 20 MG tablet Take 1 tablet (20 mg total) by mouth at bedtime. 10/14/20  Yes Karamalegos, Devonne Doughty, DO  spironolactone (ALDACTONE) 50 MG tablet Take 100 mg by mouth daily. 07/03/20  Yes [provider]  vitamin B-12 (CYANOCOBALAMIN) 1000 MCG tablet Take 1 tablet (1,000 mcg total) by mouth daily. 03/05/21  Yes Lorella Nimrod, MD   Recent Labs    03/12/21 1003 03/13/21 0604 03/13/21 0754  WBC 4.5 5.4  --   HGB 7.2* 6.4*  --   HCT 23.5* 21.9*  --   PLT 131* 150  --   K 4.0 4.5  --   CL 100 104  --   CO2 26 21*  --   BUN 36* 38*  --   CREATININE 1.52* 1.42*  --   GLUCOSE 165* 116*  --   CALCIUM 8.1* 8.0*  --   INR  --   --  1.1   DG Elbow 2 Views Right  Result Date: 03/13/2021 CLINICAL DATA:  80 year old female status post witnessed fall. EXAM: RIGHT ELBOW - 2 VIEW COMPARISON:  Right humerus series today. FINDINGS: Abnormal impaction and fragmentation of the distal right humerus involving the trochlea and olecranon fossa. Underlying chronic midshaft humerus fracture noted, but the distal metadiaphysis bone margins appear un healed, acute. However, there is relatively maintained alignment of the proximal radius and ulna which appear to be intact. IMPRESSION: Impacted fracture of the distal right humerus involving the trochlea and olecranon fossa. Burtis Junes this is acute, although a chronic midshaft humerus fracture is noted on the comparison. Relatively maintained alignment of the proximal radius and ulna which appear intact. Electronically Signed   By: Genevie Ann M.D.   On: 03/13/2021 07:34   DG Forearm Right  Result Date: 03/13/2021 CLINICAL DATA:  80 year old female status post witnessed fall. EXAM: RIGHT FOREARM - 2 VIEW COMPARISON:  Right elbow series today. FINDINGS: Abnormal distal  right humerus reported separately. The right radius and ulna appear to remain intact. Chronic ulnar styloid fracture. Relatively maintained alignment at the right wrist. Calcified peripheral vascular disease. No discrete soft tissue injury. IMPRESSION: 1. Abnormal distal right humerus reported separately. 2. No acute fracture or dislocation identified in the right forearm. Electronically Signed   By: Genevie Ann M.D.   On: 03/13/2021 07:35   CT HEAD WO CONTRAST (5MM)  Result Date: 03/13/2021 CLINICAL DATA:  80 year old female status post witnessed fall. EXAM: CT HEAD WITHOUT CONTRAST TECHNIQUE: Contiguous axial images were obtained from the base of the skull through the vertex without intravenous contrast. COMPARISON:  Head CT and Brain MRI 03/01/2021. FINDINGS: Brain: Stable cerebral volume. No midline shift, ventriculomegaly, mass effect, evidence of mass lesion, intracranial hemorrhage or evidence of cortically based acute infarction. Patchy  and confluent bilateral white matter hypodensity appears stable from last month. No cortical encephalomalacia identified. Vascular: Extensive Calcified atherosclerosis at the skull base. No suspicious intracranial vascular hyperdensity. Skull: Stable.  No acute osseous abnormality identified. Sinuses/Orbits: Left maxillary sinus mucous retention cyst is stable. Other Visualized paranasal sinuses and mastoids are stable and well aerated. Other: Broad-based forehead scalp hematoma. No scalp soft tissue gas. Underlying frontal bones appear stable and intact. Orbits soft tissues appears stable. IMPRESSION: 1. Broad-based forehead scalp hematoma without underlying skull fracture. 2. Stable from last month non contrast CT appearance of advanced cerebral white matter changes. Electronically Signed   By: Genevie Ann M.D.   On: 03/13/2021 07:24   CT Cervical Spine Wo Contrast  Result Date: 03/13/2021 CLINICAL DATA:  80 year old female status post witnessed fall. EXAM: CT CERVICAL  SPINE WITHOUT CONTRAST TECHNIQUE: Multidetector CT imaging of the cervical spine was performed without intravenous contrast. Multiplanar CT image reconstructions were also generated. COMPARISON:  Brain MRI and chest radiographs 03/01/2021. FINDINGS: Alignment: Straightening of cervical lordosis. Bilateral posterior element alignment is within normal limits. Cervicothoracic junction alignment is within normal limits. Skull base and vertebrae: Visualized skull base is intact. No atlanto-occipital dissociation. C1 and C2 appear intact and aligned. No acute osseous abnormality identified. Soft tissues and spinal canal: No prevertebral fluid or swelling. No visible canal hematoma. Negative noncontrast visible neck soft tissues. Disc levels:  Mild for age cervical spine degeneration. Upper chest: At least moderate layering left pleural effusion, questionable mildly complex fluid density. A small left pleural effusion was present last month. No left pneumothorax or apical lung contusion. Grossly intact visible upper thoracic levels. Other: Absent and carious dentition. IMPRESSION: 1. No acute traumatic injury identified in the cervical spine. 2. Layering pleural effusion in the left lung apex appears to be at least moderate in volume. Recommend follow-up Chest imaging. Electronically Signed   By: Genevie Ann M.D.   On: 03/13/2021 07:28   DG Chest Portable 1 View  Result Date: 03/13/2021 CLINICAL DATA:  Left pleural effusion.  Status post fall. EXAM: PORTABLE CHEST 1 VIEW COMPARISON:  03/01/2021 FINDINGS: Stable cardiomediastinal contours. Small left pleural effusion is identified, similar to previous exam. Pulmonary vascular congestion noted. No airspace opacities. No focal bone abnormality. IMPRESSION: 1. Stable small left pleural effusion. 2. Pulmonary vascular congestion. Correlate for clinical signs or symptoms of congestive heart failure Electronically Signed   By: Kerby Moors M.D.   On: 03/13/2021 08:10   DG  Knee Complete 4 Views Left  Result Date: 03/13/2021 CLINICAL DATA:  80 year old female status post witnessed fall. EXAM: LEFT KNEE - COMPLETE 4+ VIEW COMPARISON:  None. FINDINGS: Osteopenia. Severe lateral compartment joint space loss. Bulky tricompartmental degenerative spurring. Orthopedic staple hardware at the medial tibial metadiaphysis. No definite joint effusion. Patella appears intact. No acute osseous abnormality identified. Generalized subcutaneous soft tissue stranding. IMPRESSION: No acute fracture or dislocation identified about the left knee. Osteopenia with advanced degeneration, severe in the lateral compartment. Postoperative changes to the medial compartment. Electronically Signed   By: Genevie Ann M.D.   On: 03/13/2021 07:31   DG Knee Complete 4 Views Right  Result Date: 03/13/2021 CLINICAL DATA:  80 year old female status post witnessed fall. EXAM: RIGHT KNEE - COMPLETE 4+ VIEW COMPARISON:  None. FINDINGS: Osteopenia. Moderate medial compartment joint space loss. Mild to moderate tricompartmental degenerative spurring. Patella appears intact. No joint effusion identified. No acute osseous abnormality identified. Calcified peripheral vascular disease. Generalized subcutaneous stranding. IMPRESSION: 1. Osteopenia. No acute  fracture or dislocation identified about the right knee. 2. Medial compartment dominant degenerative changes. 3. Calcified peripheral vascular disease. Electronically Signed   By: Genevie Ann M.D.   On: 03/13/2021 07:30   DG Humerus Right  Result Date: 03/13/2021 CLINICAL DATA:  80 year old female status post witnessed fall. EXAM: RIGHT HUMERUS - 2+ VIEW COMPARISON:  None. FINDINGS: Grossly preserved alignment at the right shoulder. Angulated, healed right humeral shaft fracture with midshaft callus. The distal humerus at the elbow appears abnormal. See dedicated elbow series. Visible right ribs appear intact. IMPRESSION: 1. Distal right humerus appears abnormal. See dedicated  Right Elbow Series. 2. Chronic right midshaft humerus fracture. Maintained alignment at the right shoulder. Electronically Signed   By: Genevie Ann M.D.   On: 03/13/2021 07:32   DG Hand Complete Right  Result Date: 03/13/2021 CLINICAL DATA:  80 year old female status post witnessed fall. EXAM: RIGHT HAND - COMPLETE 3+ VIEW COMPARISON:  Right forearm series today. FINDINGS: Chronic ulnar styloid fracture. Distal radius and ulna otherwise appear intact. Carpal bone alignment and joint spaces maintained. Metacarpals appear intact. Widespread IP joint space loss and osteophytosis in keeping with osteoarthritis. No acute osseous abnormality identified. IMPRESSION: 1. No acute fracture or dislocation identified about the right hand. 2. Osteoarthritis.  Chronic ulnar styloid fracture. Electronically Signed   By: Genevie Ann M.D.   On: 03/13/2021 07:37     Positive ROS: All other systems have been reviewed and were otherwise negative with the exception of those mentioned in the HPI and as above.  Physical Exam: BP (!) 106/56   Pulse 66   Temp 97.9 F (36.6 C) (Oral)   Resp 16   Ht 5\' 3"  (1.6 m)   Wt 93 kg   SpO2 99%   BMI 36.32 kg/m  General:  Alert, no acute distress Psychiatric:  Patient is competent for consent with normal mood and affect   Cardiovascular:  No pedal edema, regular rate and rhythm Respiratory:  No wheezing, non-labored breathing GI:  Abdomen is soft and non-tender Skin:  No lesions in the area of chief complaint, no erythema Neurologic:  Sensation intact distally, CN grossly intact Lymphatic:  No axillary or cervical lymphadenopathy  Orthopedic Exam:  RUE: +ain/pin/u motor SILT r/u/m Fingers warm and well-perfused Posterior splint in place   X-rays:  As above: Impacted distal humerus fracture on the right side.  Assessment/Plan: 80 year old female admitted with symptomatic anemia with right distal humerus fracture.  1.  Recommend nonweightbearing on right upper extremity  with splint placement and sling for comfort.  2.  Patient may eventually benefit from surgery for the right elbow, but would likely need an elbow specialist given the fracture pattern.  This can be performed as an outpatient.  We will plan to arrange appropriate orthopedic follow-up for the patient.  She gets most of her care at [facility] and would prefer a [employer].  3.  PT/OT for mobilization when medically ready.  4. Will plan to follow peripherally. Please page with any questions.  Leim Fabry   03/13/2021 3:22 PM

## 2021-03-13 NOTE — Consult Note (Signed)
[place]  [phone] [phone]  ID: April Blake OB: 1941/01/31  MR#: [id number]  IEP#:[id number]  Patient Care Team: Olin Hauser, DO as PCP - General (Family Medicine)  CHIEF COMPLAINT: Acute on chronic anemia.  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  She was scheduled to receive a blood transfusion today in the cancer center, but overnight had a fall with a fractured arm and was instead brought to the emergency room.  Patient reports she did not lose consciousness and felt like she fell secondary to weakness.  She had no neurologic complaints.  She denies any recent fevers or illnesses.  She has a fair appetite, but denies weight loss.  She has no chest pain, shortness of breath, cough, or hemoptysis.  She denies any nausea, vomiting, constipation, or diarrhea.  She does not report any recent melena or hematochezia.  She has no urinary complaints.  Patient feels generally terrible, but offers no further specific complaints today.  REVIEW OF SYSTEMS:   Review of Systems  Constitutional:  Positive for malaise/fatigue. Negative for fever and weight loss.  Respiratory: Negative.  Negative for cough, hemoptysis and shortness of breath.   Cardiovascular: Negative.  Negative for chest pain and leg swelling.  Gastrointestinal: Negative.  Negative for abdominal pain, blood in stool and melena.  Genitourinary: Negative.  Negative for hematuria.  Musculoskeletal:  Positive for falls and joint pain.  Skin: Negative.  Negative for rash.  Neurological:  Positive for weakness. Negative for dizziness, focal weakness and headaches.   As per HPI. Otherwise, a complete review of systems is negative.  PAST MEDICAL HISTORY: Past Medical History:  Diagnosis Date   Anemia    Heart disease     Hyperlipidemia    Hypertension    Liver disease    Thyroid disease    Uterine cancer ([place])     PAST SURGICAL HISTORY: Past Surgical History:  Procedure Laterality Date   CATARACT EXTRACTION     cataract surg     ESOPHAGOGASTRODUODENOSCOPY (EGD) WITH PROPOFOL N/A 03/03/2021   Procedure: ESOPHAGOGASTRODUODENOSCOPY (EGD) WITH PROPOFOL;  Surgeon: Lin Landsman, MD;  Location: [facility];  Service: Gastroenterology;  Laterality: N/A;   FLEXIBLE SIGMOIDOSCOPY N/A 03/03/2021   Procedure: FLEXIBLE SIGMOIDOSCOPY;  Surgeon: Lin Landsman, MD;  Location: [facility] END[facility]COPY;  Service: Gastroenterology;  Laterality: N/A;   SKIN CANCER EXCISION     skin cancer removal     surgery to left leg      FAMILY HISTORY: History reviewed. No pertinent family history.  ADVANCED DIRECTIVES (Y/N):  @ADVDIR @  HEALTH MAINTENANCE: Social History   Tobacco Use   Smoking status: Former    Packs/day: 0.20    Years: 5.00    Pack years: 1.00    Types: Cigarettes   Smokeless tobacco: Never  Substance Use Topics   Alcohol use: Never   Drug use: Never     Colonoscopy:  PAP:  Bone density:  Lipid panel:  No Known Allergies  Current Facility-Administered Medications  Medication Dose Route Frequency Provider Last Rate Last Admin   acetaminophen (TYLENOL) tablet 1,000 mg  1,000 mg Oral Q6H PRN Agbata, Tochukwu, MD       diclofenac Sodium (VOLTAREN) 1 % topical gel 2 g  2 g Topical QID PRN Agbata, Tochukwu, MD       ferrous sulfate tablet 325 mg  325 mg  Oral BID WC Agbata, Tochukwu, MD   325 mg at 03/13/21 1620   furosemide (LASIX) tablet 40 mg  40 mg Oral Daily Agbata, Tochukwu, MD   40 mg at 03/13/21 1303   insulin aspart (novoLOG) injection 0-15 Units  0-15 Units Subcutaneous TID WC Agbata, Tochukwu, MD   2 Units at 03/13/21 1619   insulin glargine-yfgn (SEMGLEE) injection 20 Units  20 Units Subcutaneous Q2200 Agbata, Tochukwu, MD       ipratropium-albuterol (DUONEB) 0.5-2.5 (3) MG/3ML  nebulizer solution 3 mL  3 mL Nebulization QID PRN Agbata, Tochukwu, MD   3 mL at 03/13/21 1532   levothyroxine (SYNTHROID) tablet 75 mcg  75 mcg Oral Q0600 Agbata, Tochukwu, MD   75 mcg at 03/13/21 1304   methocarbamol (ROBAXIN) 500 mg in dextrose 5 % 50 mL IVPB  500 mg Intravenous Q6H PRN Agbata, Tochukwu, MD       morphine 2 MG/ML injection 1 mg  1 mg Intravenous Q4H PRN Agbata, Tochukwu, MD   1 mg at 03/13/21 1523   pantoprazole (PROTONIX) injection 40 mg  40 mg Intravenous Q12H Agbata, Tochukwu, MD   40 mg at 03/13/21 1307   simvastatin (ZOCOR) tablet 20 mg  20 mg Oral QHS Agbata, Tochukwu, MD       spironolactone (ALDACTONE) tablet 100 mg  100 mg Oral Daily Agbata, Tochukwu, MD   100 mg at 03/13/21 1303   vitamin B-12 (CYANOCOBALAMIN) tablet 1,000 mcg  1,000 mcg Oral Daily Agbata, Tochukwu, MD   1,000 mcg at 03/13/21 1305   Current Outpatient Medications  Medication Sig Dispense Refill   acetaminophen (TYLENOL) 500 MG tablet Take 1,000 mg by mouth every 4 (four) hours as needed for mild pain.     diclofenac Sodium (VOLTAREN) 1 % GEL Apply 2 g topically 4 (four) times daily as needed (osteoarthritis knee pain). 100 g 2   ferrous sulfate 325 (65 FE) MG tablet Take 1 tablet (325 mg total) by mouth 2 (two) times daily with a meal. 60 tablet 3   furosemide (LASIX) 20 MG tablet Take 2 tablets (40 mg total) by mouth daily. 60 tablet 1   insulin glargine (LANTUS SOLOSTAR) 100 UNIT/ML Solostar Pen Inject 16 Units into the skin daily.     insulin regular (NOVOLIN R) 100 units/mL injection Inject 2-12 Units into the skin 2 (two) times daily before a meal. (Based upon blood glucose result)     ipratropium-albuterol (DUONEB) 0.5-2.5 (3) MG/3ML SOLN Take 3 mLs by nebulization 4 (four) times daily as needed (shortness of breath/wheexing).     levothyroxine (SYNTHROID) 75 MCG tablet 75 mcg.     omeprazole (PRILOSEC) 40 MG capsule Take 1 capsule (40 mg total) by mouth 2 (two) times daily before a meal. 180  capsule 1   propranolol (INDERAL) 10 MG tablet Take 10 mg by mouth 2 (two) times daily.     simvastatin (ZOCOR) 20 MG tablet Take 1 tablet (20 mg total) by mouth at bedtime. 90 tablet 3   spironolactone (ALDACTONE) 50 MG tablet Take 100 mg by mouth daily.     vitamin B-12 (CYANOCOBALAMIN) 1000 MCG tablet Take 1 tablet (1,000 mcg total) by mouth daily. 90 tablet 1    OBJECTIVE: Vitals:   03/13/21 1449 03/13/21 1500  BP:    Pulse: 68 66  Resp: 14 16  Temp: 97.9 F (36.6 C)   SpO2: 98% 99%     Body mass index is 36.32 kg/m.    ECOG FS:3 -  Symptomatic, >50% confined to bed  General: Well-developed, well-nourished, no acute distress. Eyes: Pink conjunctiva, anicteric sclera. HEENT: Normocephalic, moist mucous membranes. Lungs: No audible wheezing or coughing. Heart: Regular rate and rhythm. Abdomen: Soft, nontender, no obvious distention. Musculoskeletal: No edema, cyanosis, or clubbing. Neuro: Alert, answering all questions appropriately. Cranial nerves grossly intact. Skin: No rashes or petechiae noted. Psych: Normal affect. Lymphatics: No cervical, calvicular, axillary or inguinal LAD.   LAB RESULTS:  Lab Results  Component Value Date   NA 135 03/13/2021   K 4.5 03/13/2021   CL 104 03/13/2021   CO2 21 (L) 03/13/2021   GLUCOSE 116 (H) 03/13/2021   BUN 38 (H) 03/13/2021   CREATININE 1.42 (H) 03/13/2021   CALCIUM 8.0 (L) 03/13/2021   PROT 5.0 (L) 03/13/2021   ALBUMIN 1.9 (L) 03/13/2021   AST 24 03/13/2021   ALT 10 03/13/2021   ALKPHOS 48 03/13/2021   BILITOT 1.3 (H) 03/13/2021   GFRNONAA 38 (L) 03/13/2021    Lab Results  Component Value Date   WBC 5.4 03/13/2021   NEUTROABS 4.1 03/13/2021   HGB 6.4 (L) 03/13/2021   HCT 21.9 (L) 03/13/2021   MCV 100.0 03/13/2021   PLT 150 03/13/2021     STUDIES: DG Chest 2 View  Result Date: 03/01/2021 CLINICAL DATA:  Weakness. EXAM: CHEST - 2 VIEW COMPARISON:  None FINDINGS: Small left pleural effusion and left lower  lobe atelectasis or infiltrate. The right lung is clear. No pneumothorax the cardiac silhouette is within limits. No acute osseous pathology. IMPRESSION: Small left pleural effusion and left lower lobe atelectasis or infiltrate. Electronically Signed   By: Anner Crete M.D.   On: 03/01/2021 19:53   DG Elbow 2 Views Right  Result Date: 03/13/2021 CLINICAL DATA:  80 year old female status post witnessed fall. EXAM: RIGHT ELBOW - 2 VIEW COMPARISON:  Right humerus series today. FINDINGS: Abnormal impaction and fragmentation of the distal right humerus involving the trochlea and olecranon fossa. Underlying chronic midshaft humerus fracture noted, but the distal metadiaphysis bone margins appear un healed, acute. However, there is relatively maintained alignment of the proximal radius and ulna which appear to be intact. IMPRESSION: Impacted fracture of the distal right humerus involving the trochlea and olecranon fossa. Burtis Junes this is acute, although a chronic midshaft humerus fracture is noted on the comparison. Relatively maintained alignment of the proximal radius and ulna which appear intact. Electronically Signed   By: Genevie Ann M.D.   On: 03/13/2021 07:34   DG Forearm Right  Result Date: 03/13/2021 CLINICAL DATA:  80 year old female status post witnessed fall. EXAM: RIGHT FOREARM - 2 VIEW COMPARISON:  Right elbow series today. FINDINGS: Abnormal distal right humerus reported separately. The right radius and ulna appear to remain intact. Chronic ulnar styloid fracture. Relatively maintained alignment at the right wrist. Calcified peripheral vascular disease. No discrete soft tissue injury. IMPRESSION: 1. Abnormal distal right humerus reported separately. 2. No acute fracture or dislocation identified in the right forearm. Electronically Signed   By: Genevie Ann M.D.   On: 03/13/2021 07:35   CT HEAD WO CONTRAST (5MM)  Result Date: 03/13/2021 CLINICAL DATA:  80 year old female status post witnessed fall. EXAM:  CT HEAD WITHOUT CONTRAST TECHNIQUE: Contiguous axial images were obtained from the base of the skull through the vertex without intravenous contrast. COMPARISON:  Head CT and Brain MRI 03/01/2021. FINDINGS: Brain: Stable cerebral volume. No midline shift, ventriculomegaly, mass effect, evidence of mass lesion, intracranial hemorrhage or evidence of cortically based acute infarction.  Patchy and confluent bilateral white matter hypodensity appears stable from last month. No cortical encephalomalacia identified. Vascular: Extensive Calcified atherosclerosis at the skull base. No suspicious intracranial vascular hyperdensity. Skull: Stable.  No acute osseous abnormality identified. Sinuses/Orbits: Left maxillary sinus mucous retention cyst is stable. Other Visualized paranasal sinuses and mastoids are stable and well aerated. Other: Broad-based forehead scalp hematoma. No scalp soft tissue gas. Underlying frontal bones appear stable and intact. Orbits soft tissues appears stable. IMPRESSION: 1. Broad-based forehead scalp hematoma without underlying skull fracture. 2. Stable from last month non contrast CT appearance of advanced cerebral white matter changes. Electronically Signed   By: Genevie Ann M.D.   On: 03/13/2021 07:24   CT Head Wo Contrast  Result Date: 03/01/2021 CLINICAL DATA:  Transient ischemic attack EXAM: CT HEAD WITHOUT CONTRAST TECHNIQUE: Contiguous axial images were obtained from the base of the skull through the vertex without intravenous contrast. COMPARISON:  None. BRAIN: BRAIN Patchy and confluent areas of decreased attenuation are noted throughout the deep and periventricular white matter of the cerebral hemispheres bilaterally, compatible with chronic microvascular ischemic disease. No evidence of large-territorial acute infarction. No parenchymal hemorrhage. No mass lesion. No extra-axial collection. No mass effect or midline shift. No hydrocephalus. Basilar cisterns are patent. Vascular: No  hyperdense vessel. Atherosclerotic calcifications are present within the cavernous internal carotid arteries. Skull: No acute fracture or focal lesion. Sinuses/Orbits: Left maxillary mucosal thickening. Otherwise paranasal sinuses and mastoid air cells are clear. Bilateral lens replacement. Otherwise orbits are unremarkable. Other: None. IMPRESSION: No acute intracranial abnormality. Electronically Signed   By: Iven Finn M.D.   On: 03/01/2021 19:24   CT Cervical Spine Wo Contrast  Result Date: 03/13/2021 CLINICAL DATA:  80 year old female status post witnessed fall. EXAM: CT CERVICAL SPINE WITHOUT CONTRAST TECHNIQUE: Multidetector CT imaging of the cervical spine was performed without intravenous contrast. Multiplanar CT image reconstructions were also generated. COMPARISON:  Brain MRI and chest radiographs 03/01/2021. FINDINGS: Alignment: Straightening of cervical lordosis. Bilateral posterior element alignment is within normal limits. Cervicothoracic junction alignment is within normal limits. Skull base and vertebrae: Visualized skull base is intact. No atlanto-occipital dissociation. C1 and C2 appear intact and aligned. No acute osseous abnormality identified. Soft tissues and spinal canal: No prevertebral fluid or swelling. No visible canal hematoma. Negative noncontrast visible neck soft tissues. Disc levels:  Mild for age cervical spine degeneration. Upper chest: At least moderate layering left pleural effusion, questionable mildly complex fluid density. A small left pleural effusion was present last month. No left pneumothorax or apical lung contusion. Grossly intact visible upper thoracic levels. Other: Absent and carious dentition. IMPRESSION: 1. No acute traumatic injury identified in the cervical spine. 2. Layering pleural effusion in the left lung apex appears to be at least moderate in volume. Recommend follow-up Chest imaging. Electronically Signed   By: Genevie Ann M.D.   On: 03/13/2021 07:28    MR BRAIN WO CONTRAST  Result Date: 03/01/2021 CLINICAL DATA:  Mental status change, unknown cause EXAM: MRI HEAD WITHOUT CONTRAST TECHNIQUE: Multiplanar, multiecho pulse sequences of the brain and surrounding structures were obtained without intravenous contrast. COMPARISON:  No prior MRI, correlation is made with CT head 03/01/2021 FINDINGS: Evaluation is limited by motion artifact. Brain: No restricted diffusion to suggest acute infarction; diffusion-weighted sequences are not as motion limited. No definite acute hemorrhage, mass, mass effect, or midline shift. Confluent T2 hyperintense signal in the periventricular white matter, likely the sequela of severe chronic small vessel ischemic disease. Vascular: Normal  flow voids. Skull and upper cervical spine: Unable to evaluate secondary to motion. Sinuses/Orbits: Left maxillary mucous retention cyst. Status post bilateral lens replacements. Other: The mastoids are well aerated. IMPRESSION: Evaluation is limited by motion, which affects nearly all sequences. Within this limitation, no acute intracranial process is seen. Electronically Signed   By: Merilyn Baba M.D.   On: 03/01/2021 23:57   DG Chest Portable 1 View  Result Date: 03/13/2021 CLINICAL DATA:  Left pleural effusion.  Status post fall. EXAM: PORTABLE CHEST 1 VIEW COMPARISON:  03/01/2021 FINDINGS: Stable cardiomediastinal contours. Small left pleural effusion is identified, similar to previous exam. Pulmonary vascular congestion noted. No airspace opacities. No focal bone abnormality. IMPRESSION: 1. Stable small left pleural effusion. 2. Pulmonary vascular congestion. Correlate for clinical signs or symptoms of congestive heart failure Electronically Signed   By: Kerby Moors M.D.   On: 03/13/2021 08:10   DG Knee Complete 4 Views Left  Result Date: 03/13/2021 CLINICAL DATA:  80 year old female status post witnessed fall. EXAM: LEFT KNEE - COMPLETE 4+ VIEW COMPARISON:  None. FINDINGS:  Osteopenia. Severe lateral compartment joint space loss. Bulky tricompartmental degenerative spurring. Orthopedic staple hardware at the medial tibial metadiaphysis. No definite joint effusion. Patella appears intact. No acute osseous abnormality identified. Generalized subcutaneous soft tissue stranding. IMPRESSION: No acute fracture or dislocation identified about the left knee. Osteopenia with advanced degeneration, severe in the lateral compartment. Postoperative changes to the medial compartment. Electronically Signed   By: Genevie Ann M.D.   On: 03/13/2021 07:31   DG Knee Complete 4 Views Right  Result Date: 03/13/2021 CLINICAL DATA:  80 year old female status post witnessed fall. EXAM: RIGHT KNEE - COMPLETE 4+ VIEW COMPARISON:  None. FINDINGS: Osteopenia. Moderate medial compartment joint space loss. Mild to moderate tricompartmental degenerative spurring. Patella appears intact. No joint effusion identified. No acute osseous abnormality identified. Calcified peripheral vascular disease. Generalized subcutaneous stranding. IMPRESSION: 1. Osteopenia. No acute fracture or dislocation identified about the right knee. 2. Medial compartment dominant degenerative changes. 3. Calcified peripheral vascular disease. Electronically Signed   By: Genevie Ann M.D.   On: 03/13/2021 07:30   DG Humerus Right  Result Date: 03/13/2021 CLINICAL DATA:  80 year old female status post witnessed fall. EXAM: RIGHT HUMERUS - 2+ VIEW COMPARISON:  None. FINDINGS: Grossly preserved alignment at the right shoulder. Angulated, healed right humeral shaft fracture with midshaft callus. The distal humerus at the elbow appears abnormal. See dedicated elbow series. Visible right ribs appear intact. IMPRESSION: 1. Distal right humerus appears abnormal. See dedicated Right Elbow Series. 2. Chronic right midshaft humerus fracture. Maintained alignment at the right shoulder. Electronically Signed   By: Genevie Ann M.D.   On: 03/13/2021 07:32   DG  Hand Complete Right  Result Date: 03/13/2021 CLINICAL DATA:  80 year old female status post witnessed fall. EXAM: RIGHT HAND - COMPLETE 3+ VIEW COMPARISON:  Right forearm series today. FINDINGS: Chronic ulnar styloid fracture. Distal radius and ulna otherwise appear intact. Carpal bone alignment and joint spaces maintained. Metacarpals appear intact. Widespread IP joint space loss and osteophytosis in keeping with osteoarthritis. No acute osseous abnormality identified. IMPRESSION: 1. No acute fracture or dislocation identified about the right hand. 2. Osteoarthritis.  Chronic ulnar styloid fracture. Electronically Signed   By: Genevie Ann M.D.   On: 03/13/2021 07:37    ASSESSMENT: Acute on chronic anemia.  PLAN:    Acute on chronic anemia: Patient requires periodic blood transfusions secondary to a complicated history which includes cirrhosis and GAVE syndrome.  Patient was supposed to receive transfusion in the cancer center today, but had a fall overnight.  Hemoglobin upon admission was 6.4.  Agree with transfusion and try to maintain hemoglobin greater than 8.0. Cough/arm fracture: MRI of the brain revealed no acute process.  Likely secondary to weakness from anemia.  Transfuse as above. Disposition: We will arrange follow-up in cancer center upon discharge.  Appreciate consult, call with questions.   Lloyd Huger, MD   03/13/2021 4:41 PM

## 2021-03-13 NOTE — ED Notes (Signed)
NT x2 in room placed right arm split on arm from top of arm to wrist per order.  Family in room. Lab present to draw TYPE and Screen.

## 2021-03-13 NOTE — H&P (Signed)
History and Physical    April Blake ZDG:[id number] DOB: [birth year] DOA: 03/13/2021  PCP: Olin Hauser, DO   Patient coming from: Home  I have personally briefly reviewed patient's old medical records in [place]  Chief Complaint: Fall  HPI: April Blake is a 80 y.o. 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  Patient had gone to use the commode and states that her legs gave out from under her and she fell face forward landing on her knees and right elbow.  She is noted to have bruising over the right forehead and left side of her cheek as well as a skin tear to the left wrist. She denied feeling dizzy or lightheaded prior to the fall and states that her legs gave way.  She states that her stools are brown but usually mixed with blood. She denies having any abdominal pain, no hematemesis, no melena stools, no headache, no fever, no chills, no cough, no fever, no chills, no urinary frequency, no dysuria or nocturia. Sodium 135, potassium 4.5, chloride 104, bicarb 21, glucose 116, BUN 38, creatinine 1.42, calcium 8.0, alkaline phosphatase 48, albumin 1.9, AST 24, ALT 10, total protein 5.0, white count 5.4, hemoglobin 6.4, hematocrit 21.9, MCV 100, RDW 19.0, platelet count 150 CT scan of cervical spine without contrast shows no acute traumatic injury.  Layering pleural effusion in the left lung appears to be at least moderate in volume. CT scan of the head without contrast showed broad-based forehead scalp hematoma without underlying skull fracture.  Advanced  cerebral white matter changes. Chest x-ray reviewed by me shows pulmonary vascular congestion.  Stable small left pleural effusion. X-ray of right elbow shows impacted fracture of the distal right humerus involving the trochlea and olecranon fossa. Right forearm x-ray shows abnormal distal right humerus.  No acute fracture or dislocation in the right forearm. Right hand x-ray shows no acute fracture or dislocation in the right hand.  Osteoarthritis.  Chronic ulnar styloid fracture. X-ray of the right humerus shows a chronic right midshaft humeral fracture. Left knee x-ray shows no acute fracture or dislocation.  Osteopenia with advanced degeneration, severe in the left lateral compartment.   ED Course: Patient is a 80 year old female with a history of ongoing blood loss from GAVE requiring frequent blood transfusions who presents to the ER for evaluation following a witnessed fall at home. X-ray of the elbow shows an impacted fracture of the distal right humerus involving the trochlea and olecranon fossa. Orthopedic surgery was consulted by the ER She will be referred to observation status for further evaluation.   Review of Systems: As per HPI otherwise all other systems reviewed and negative.    Past Medical History:  Diagnosis Date   Anemia    Heart disease    Hyperlipidemia    Hypertension    Liver disease    Thyroid disease    Uterine cancer [facility])     Past Surgical History:  Procedure Laterality Date   CATARACT EXTRACTION     cataract surg     ESOPHAGOGASTRODUODENOSCOPY (EGD) WITH PROPOFOL N/A 03/03/2021   Procedure: ESOPHAGOGASTRODUODENOSCOPY (EGD) WITH PROPOFOL;  Surgeon: Lin Landsman, MD;  Location: [facility] ENDOSCOPY;  Service: Gastroenterology;  Laterality: N/A;   FLEXIBLE SIGMOIDOSCOPY N/A 03/03/2021   Procedure: FLEXIBLE SIGMOIDOSCOPY;  Surgeon: Lin Landsman, MD;  Location[facility] ENDOSCOPY;  Service: Gastroenterology;  Laterality: N/A;   SKIN CANCER EXCISION      skin cancer removal     surgery to left leg       reports that she has quit smoking. Her smoking use included cigarettes. She has a 1.00 pack-year smoking history. She has never used smokeless tobacco. She reports that she does not drink alcohol and does not use drugs.  No Known Allergies  History reviewed. No pertinent family history.  No significant past medical history   Prior to Admission medications   Medication Sig Start Date End Date Taking? Authorizing Provider  acetaminophen (TYLENOL) 500 MG tablet Take 1,000 mg by mouth every 4 (four) hours as needed for mild pain.    [provider]  albuterol (VENTOLIN HFA) 108 (90 Base) MCG/ACT inhaler  09/22/17   [provider]  diclofenac Sodium (VOLTAREN) 1 % GEL Apply 2 g topically 4 (four) times daily as needed (osteoarthritis knee pain). 10/14/20   Karamalegos, Devonne Doughty, DO  ferrous sulfate 325 (65 FE) MG tablet Take 1 tablet (325 mg total) by mouth 2 (two) times daily with a meal. 03/05/21   Lorella Nimrod, MD  furosemide (LASIX) 20 MG tablet Take 2 tablets (40 mg total) by mouth daily. 03/05/21   Lorella Nimrod, MD  insulin glargine (LANTUS SOLOSTAR) 100 UNIT/ML Solostar Pen 20 units in the morning 03/18/18   [provider]  insulin regular (NOVOLIN R) 100 units/mL injection Inject 6-12 Units into the skin 2 (two) times daily before a meal. 06/27/17   [provider]  ipratropium-albuterol (DUONEB) 0.5-2.5 (3) MG/3ML SOLN Take 3 mLs by nebulization 4 (four) times daily as needed. 01/31/21   [provider]  levothyroxine (SYNTHROID) 75 MCG tablet 1 tablet in the morning on an empty stomach    [provider]  omeprazole (PRILOSEC) 40 MG capsule Take 1 capsule (40 mg total) by mouth 2 (two) times daily before a meal. 01/15/21   Karamalegos, Devonne Doughty, DO  ondansetron (ZOFRAN) 4 MG tablet  04/02/20   [provider]  polyethylene glycol powder (GLYCOLAX/MIRALAX) 17 GM/SCOOP  powder Take by mouth. Patient not taking: Reported on 03/02/2021    [provider]  propranolol (INDERAL) 10 MG tablet Take 10 mg by mouth 2 (two) times daily. 08/15/17   [provider]  simvastatin (ZOCOR) 20 MG tablet Take 1 tablet (20 mg total) by mouth at bedtime. 10/14/20   Karamalegos, Devonne Doughty, DO  spironolactone (ALDACTONE) 50 MG tablet Take 100 mg by mouth daily. 07/03/20   [provider]  vitamin B-12 (CYANOCOBALAMIN) 1000 MCG tablet Take 1 tablet (1,000 mcg total) by mouth daily. 03/05/21   Lorella Nimrod, MD    Physical Exam: Vitals:   03/13/21 0530 03/13/21 0600 03/13/21 0826 03/13/21 0916  BP: (!) 102/49 (!) 99/52 (!) 103/53 104/66  Pulse: 78 72  71  Resp: 17 17 18 18   Temp:    (!) 97.4 F (36.3 C)  TempSrc:    Rectal  SpO2: 98% 100% 100% 98%  Weight:      Height:         Vitals:   03/13/21 0530 03/13/21 0600 03/13/21 0826 03/13/21 0916  BP: (!) 102/49 (!) 99/52 (!) 103/53 104/66  Pulse: 78 72  71  Resp: 17 17 18 18   Temp:    (!) 97.4 F (36.3 C)  TempSrc:    Rectal  SpO2: 98% 100% 100% 98%  Weight:      Height:          Constitutional: Alert and oriented x 3 . Not in any apparent distress.  Morbidly obese HEENT:      Head: Normocephalic and atraumatic.         Eyes: PERLA, EOMI, Conjunctivae pallor. Sclera is non-icteric.       Mouth/Throat: Mucous membranes are dry.       Neck: Supple with no signs of meningismus. Cardiovascular: Regular rate and rhythm. No murmurs, gallops, or rubs. 2+ symmetrical distal pulses are present . No JVD. 3+ LE edema Respiratory: Respiratory effort normal .crackles at the left base.  No wheezes, or rhonchi.  Gastrointestinal: Soft, non tender, and non distended with positive bowel sounds.  Central adiposity Genitourinary: No CVA tenderness. Musculoskeletal: Decrease range of motion right shoulder.  No cyanosis, or erythema of extremities. Neurologic:  Face is symmetric. Moving all extremities. No  gross focal neurologic deficits .  Generalized weakness Skin: Skin is warm, dry.  Ecchymosis over the forehead and the left side of the face Psychiatric: Mood and affect are normal    Labs on Admission: I have personally reviewed following labs and imaging studies  CBC: Recent Labs  Lab 03/12/21 1003 03/13/21 0604  WBC 4.5 5.4  NEUTROABS 3.5 4.1  HGB 7.2* 6.4*  HCT 23.5* 21.9*  MCV 94.8 100.0  PLT 131* 725   Basic Metabolic Panel: Recent Labs  Lab 03/12/21 1003 03/13/21 0604  NA 135 135  K 4.0 4.5  CL 100 104  CO2 26 21*  GLUCOSE 165* 116*  BUN 36* 38*  CREATININE 1.52* 1.42*  CALCIUM 8.1* 8.0*   GFR: Estimated Creatinine Clearance: 35.4 mL/min (A) (by C-G formula based on SCr of 1.42 mg/dL (H)). Liver Function Tests: Recent Labs  Lab 03/12/21 1003 03/13/21 0604  AST 25 24  ALT 13 10  ALKPHOS 61 48  BILITOT 1.3* 1.3*  PROT 5.5* 5.0*  ALBUMIN 2.1* 1.9*   No results for input(s): LIPASE, AMYLASE in the last 168 hours. No results for input(s): AMMONIA in the last 168 hours. Coagulation Profile: Recent Labs  Lab 03/13/21 0754  INR 1.1   Cardiac Enzymes: No results for input(s): CKTOTAL, CKMB, CKMBINDEX, TROPONINI in the last 168 hours. BNP (last 3 results) No results for input(s): PROBNP in the last 8760 hours. HbA1C: No results for input(s): HGBA1C in the last 72 hours. CBG: No results for input(s): GLUCAP in the last 168 hours. Lipid Profile: No results for input(s): CHOL, HDL, LDLCALC, TRIG, CHOLHDL, LDLDIRECT in the last 72 hours. Thyroid Function Tests: No results for input(s): TSH, T4TOTAL, FREET4, T3FREE, THYROIDAB in the last 72 hours. Anemia Panel: No results for input(s): VITAMINB12, FOLATE, FERRITIN, TIBC, IRON, RETICCTPCT in the last 72 hours. Urine analysis:    Component Value Date/Time   COLORURINE YELLOW (A) 03/01/2021 1722   APPEARANCEUR CLEAR (A) 03/01/2021 1722   LABSPEC 1.015 03/01/2021 1722   PHURINE 7.0 03/01/2021 1722    GLUCOSEU NEGATIVE 03/01/2021 1722   HGBUR TRACE (A) 03/01/2021 1722   BILIRUBINUR NEGATIVE 03/01/2021 1722   KETONESUR NEGATIVE 03/01/2021 1722   PROTEINUR NEGATIVE 03/01/2021 1722   NITRITE NEGATIVE 03/01/2021 1722   LEUKOCYTESUR NEGATIVE 03/01/2021 1722    Radiological Exams on Admission: DG Elbow 2 Views Right  Result Date:  03/13/2021 CLINICAL DATA:  80 year old female status post witnessed fall. EXAM: RIGHT ELBOW - 2 VIEW COMPARISON:  Right humerus series today. FINDINGS: Abnormal impaction and fragmentation of the distal right humerus involving the trochlea and olecranon fossa. Underlying chronic midshaft humerus fracture noted, but the distal metadiaphysis bone margins appear un healed, acute. However, there is relatively maintained alignment of the proximal radius and ulna which appear to be intact. IMPRESSION: Impacted fracture of the distal right humerus involving the trochlea and olecranon fossa. Burtis Junes this is acute, although a chronic midshaft humerus fracture is noted on the comparison. Relatively maintained alignment of the proximal radius and ulna which appear intact. Electronically Signed   By: Genevie Ann M.D.   On: 03/13/2021 07:34   DG Forearm Right  Result Date: 03/13/2021 CLINICAL DATA:  80 year old female status post witnessed fall. EXAM: RIGHT FOREARM - 2 VIEW COMPARISON:  Right elbow series today. FINDINGS: Abnormal distal right humerus reported separately. The right radius and ulna appear to remain intact. Chronic ulnar styloid fracture. Relatively maintained alignment at the right wrist. Calcified peripheral vascular disease. No discrete soft tissue injury. IMPRESSION: 1. Abnormal distal right humerus reported separately. 2. No acute fracture or dislocation identified in the right forearm. Electronically Signed   By: Genevie Ann M.D.   On: 03/13/2021 07:35   CT HEAD WO CONTRAST (5MM)  Result Date: 03/13/2021 CLINICAL DATA:  80 year old female status post witnessed fall. EXAM: CT  HEAD WITHOUT CONTRAST TECHNIQUE: Contiguous axial images were obtained from the base of the skull through the vertex without intravenous contrast. COMPARISON:  Head CT and Brain MRI 03/01/2021. FINDINGS: Brain: Stable cerebral volume. No midline shift, ventriculomegaly, mass effect, evidence of mass lesion, intracranial hemorrhage or evidence of cortically based acute infarction. Patchy and confluent bilateral white matter hypodensity appears stable from last month. No cortical encephalomalacia identified. Vascular: Extensive Calcified atherosclerosis at the skull base. No suspicious intracranial vascular hyperdensity. Skull: Stable.  No acute osseous abnormality identified. Sinuses/Orbits: Left maxillary sinus mucous retention cyst is stable. Other Visualized paranasal sinuses and mastoids are stable and well aerated. Other: Broad-based forehead scalp hematoma. No scalp soft tissue gas. Underlying frontal bones appear stable and intact. Orbits soft tissues appears stable. IMPRESSION: 1. Broad-based forehead scalp hematoma without underlying skull fracture. 2. Stable from last month non contrast CT appearance of advanced cerebral white matter changes. Electronically Signed   By: Genevie Ann M.D.   On: 03/13/2021 07:24   CT Cervical Spine Wo Contrast  Result Date: 03/13/2021 CLINICAL DATA:  80 year old female status post witnessed fall. EXAM: CT CERVICAL SPINE WITHOUT CONTRAST TECHNIQUE: Multidetector CT imaging of the cervical spine was performed without intravenous contrast. Multiplanar CT image reconstructions were also generated. COMPARISON:  Brain MRI and chest radiographs 03/01/2021. FINDINGS: Alignment: Straightening of cervical lordosis. Bilateral posterior element alignment is within normal limits. Cervicothoracic junction alignment is within normal limits. Skull base and vertebrae: Visualized skull base is intact. No atlanto-occipital dissociation. C1 and C2 appear intact and aligned. No acute osseous  abnormality identified. Soft tissues and spinal canal: No prevertebral fluid or swelling. No visible canal hematoma. Negative noncontrast visible neck soft tissues. Disc levels:  Mild for age cervical spine degeneration. Upper chest: At least moderate layering left pleural effusion, questionable mildly complex fluid density. A small left pleural effusion was present last month. No left pneumothorax or apical lung contusion. Grossly intact visible upper thoracic levels. Other: Absent and carious dentition. IMPRESSION: 1. No acute traumatic injury identified in the cervical spine.  2. Layering pleural effusion in the left lung apex appears to be at least moderate in volume. Recommend follow-up Chest imaging. Electronically Signed   By: Genevie Ann M.D.   On: 03/13/2021 07:28   DG Chest Portable 1 View  Result Date: 03/13/2021 CLINICAL DATA:  Left pleural effusion.  Status post fall. EXAM: PORTABLE CHEST 1 VIEW COMPARISON:  03/01/2021 FINDINGS: Stable cardiomediastinal contours. Small left pleural effusion is identified, similar to previous exam. Pulmonary vascular congestion noted. No airspace opacities. No focal bone abnormality. IMPRESSION: 1. Stable small left pleural effusion. 2. Pulmonary vascular congestion. Correlate for clinical signs or symptoms of congestive heart failure Electronically Signed   By: Kerby Moors M.D.   On: 03/13/2021 08:10   DG Knee Complete 4 Views Left  Result Date: 03/13/2021 CLINICAL DATA:  80 year old female status post witnessed fall. EXAM: LEFT KNEE - COMPLETE 4+ VIEW COMPARISON:  None. FINDINGS: Osteopenia. Severe lateral compartment joint space loss. Bulky tricompartmental degenerative spurring. Orthopedic staple hardware at the medial tibial metadiaphysis. No definite joint effusion. Patella appears intact. No acute osseous abnormality identified. Generalized subcutaneous soft tissue stranding. IMPRESSION: No acute fracture or dislocation identified about the left knee.  Osteopenia with advanced degeneration, severe in the lateral compartment. Postoperative changes to the medial compartment. Electronically Signed   By: Genevie Ann M.D.   On: 03/13/2021 07:31   DG Knee Complete 4 Views Right  Result Date: 03/13/2021 CLINICAL DATA:  80 year old female status post witnessed fall. EXAM: RIGHT KNEE - COMPLETE 4+ VIEW COMPARISON:  None. FINDINGS: Osteopenia. Moderate medial compartment joint space loss. Mild to moderate tricompartmental degenerative spurring. Patella appears intact. No joint effusion identified. No acute osseous abnormality identified. Calcified peripheral vascular disease. Generalized subcutaneous stranding. IMPRESSION: 1. Osteopenia. No acute fracture or dislocation identified about the right knee. 2. Medial compartment dominant degenerative changes. 3. Calcified peripheral vascular disease. Electronically Signed   By: Genevie Ann M.D.   On: 03/13/2021 07:30   DG Humerus Right  Result Date: 03/13/2021 CLINICAL DATA:  80 year old female status post witnessed fall. EXAM: RIGHT HUMERUS - 2+ VIEW COMPARISON:  None. FINDINGS: Grossly preserved alignment at the right shoulder. Angulated, healed right humeral shaft fracture with midshaft callus. The distal humerus at the elbow appears abnormal. See dedicated elbow series. Visible right ribs appear intact. IMPRESSION: 1. Distal right humerus appears abnormal. See dedicated Right Elbow Series. 2. Chronic right midshaft humerus fracture. Maintained alignment at the right shoulder. Electronically Signed   By: Genevie Ann M.D.   On: 03/13/2021 07:32   DG Hand Complete Right  Result Date: 03/13/2021 CLINICAL DATA:  80 year old female status post witnessed fall. EXAM: RIGHT HAND - COMPLETE 3+ VIEW COMPARISON:  Right forearm series today. FINDINGS: Chronic ulnar styloid fracture. Distal radius and ulna otherwise appear intact. Carpal bone alignment and joint spaces maintained. Metacarpals appear intact. Widespread IP joint space loss  and osteophytosis in keeping with osteoarthritis. No acute osseous abnormality identified. IMPRESSION: 1. No acute fracture or dislocation identified about the right hand. 2. Osteoarthritis.  Chronic ulnar styloid fracture. Electronically Signed   By: Genevie Ann M.D.   On: 03/13/2021 07:37     Assessment/Plan Principal Problem:   Symptomatic anemia Active Problems:   Liver cirrhosis secondary to NASH (HCC)   GAVE (gastric antral vascular ectasia)   Hypothyroidism   Acute on chronic blood loss anemia   CKD stage 3 due to type 2 diabetes mellitus ([place])       Acute on chronic blood loss  anemia Secondary to ongoing GI blood loss from GAVE (gastric antral vascular ectasia) Hemoglobin is 6.4g/dl Will transfuse 2 units of packed RBC Palliative care has seen the patient and family is open to hospice evaluation         Status post fall with right humerus fracture Immobilize right upper extremity Pain control Consult orthopedic surgery     Diabetes mellitus with complications of stage III chronic kidney disease Maintain consistent carbohydrate diet Continue long-acting insulin Glycemic control with sliding scale coverage Renal function is stable        Liver cirrhosis secondary to NASH (HCC) Continue propranolol, Lasix and Aldactone Maintain low-sodium diet        Hypothyroidism Continue levothyroxine        Uterine cancer ([place]) Followed locally by Dr. Grayland Ormond   DVT prophylaxis: SCD  Code Status: DO NOT RESUSCITATE Family Communication: Greater than 50% of time was spent discussing patient's condition and plan of care with her and her daughter at the bedside.  All questions and concerns have been addressed.  They verbalized understanding and agree with the plan.  CODE STATUS was discussed and she is a DO NOT RESUSCITATE. Disposition Plan: Back to previous home environment Consults called: Orthopedic surgery Status:Observation    Garik Diamant MD [facility]     03/13/2021, 9:24 AM

## 2021-03-13 NOTE — Telephone Encounter (Signed)
Daughter called to inform us the pt is in ED and is going to be admitted.

## 2021-03-13 NOTE — ED Notes (Signed)
Bacitracin applied to right lower arm skin tear x2 areas. Vaseline gauze applied with [place].

## 2021-03-13 NOTE — Progress Notes (Signed)
Patient had a small, bloody BM with clots. Changed and repositioned.

## 2021-03-13 NOTE — ED Notes (Signed)
1st unit of blood completed. VSS. No complaints of discomfort.  Resting with eyes closed.  Warm blanket placed on patient. Daughter in room

## 2021-03-13 NOTE — ED Triage Notes (Addendum)
Pt presents to ER via [facility] from home after a witnessed fall.  Pt was being helped to toilet by family, when her legs just gave out from under her.  Pt fell forwards on her knees and right elbow, and hit her forehead on piece of furniture.  Pts right elbow is splinted at this time, pt has skin tear to left wrist. Pt is not on blood thinners and there was no LOC.  Pt A&O x4 at this time.

## 2021-03-13 NOTE — ED Notes (Signed)
Patient to CT/xray

## 2021-03-13 NOTE — ED Notes (Signed)
Informed RN bed assigned 

## 2021-03-14 ENCOUNTER — Observation Stay: Payer: Medicare Other

## 2021-03-14 DIAGNOSIS — D649 Anemia, unspecified: Secondary | ICD-10-CM | POA: Diagnosis not present

## 2021-03-14 LAB — CBC
HCT: 22.8 % — ABNORMAL LOW (ref 36.0–46.0)
Hemoglobin: 7.3 g/dL — ABNORMAL LOW (ref 12.0–15.0)
MCH: 28.5 pg (ref 26.0–34.0)
MCHC: 32 g/dL (ref 30.0–36.0)
MCV: 89.1 fL (ref 80.0–100.0)
Platelets: 133 10*3/uL — ABNORMAL LOW (ref 150–400)
RBC: 2.56 MIL/uL — ABNORMAL LOW (ref 3.87–5.11)
RDW: 19 % — ABNORMAL HIGH (ref 11.5–15.5)
WBC: 3.7 10*3/uL — ABNORMAL LOW (ref 4.0–10.5)
nRBC: 0 % (ref 0.0–0.2)

## 2021-03-14 LAB — GLUCOSE, CAPILLARY
Glucose-Capillary: 118 mg/dL — ABNORMAL HIGH (ref 70–99)
Glucose-Capillary: 187 mg/dL — ABNORMAL HIGH (ref 70–99)
Glucose-Capillary: 191 mg/dL — ABNORMAL HIGH (ref 70–99)
Glucose-Capillary: 179 mg/dL — ABNORMAL HIGH (ref 70–99)

## 2021-03-14 LAB — HEMOGLOBIN AND HEMATOCRIT, BLOOD
HCT: 23 % — ABNORMAL LOW (ref 36.0–46.0)
HCT: 23.3 % — ABNORMAL LOW (ref 36.0–46.0)
Hemoglobin: 7.5 g/dL — ABNORMAL LOW (ref 12.0–15.0)
Hemoglobin: 7.6 g/dL — ABNORMAL LOW (ref 12.0–15.0)

## 2021-03-14 LAB — BASIC METABOLIC PANEL
Anion gap: 7 (ref 5–15)
BUN: 40 mg/dL — ABNORMAL HIGH (ref 8–23)
CO2: 26 mmol/L (ref 22–32)
Calcium: 7.8 mg/dL — ABNORMAL LOW (ref 8.9–10.3)
Chloride: 100 mmol/L (ref 98–111)
Creatinine, Ser: 1.56 mg/dL — ABNORMAL HIGH (ref 0.44–1.00)
GFR, Estimated: 34 mL/min — ABNORMAL LOW (ref >60)
Glucose, Bld: 122 mg/dL — ABNORMAL HIGH (ref 70–99)
Potassium: 4.3 mmol/L (ref 3.5–5.1)
Sodium: 133 mmol/L — ABNORMAL LOW (ref 135–145)

## 2021-03-14 MED ORDER — ALBUMIN HUMAN 25 % IV SOLN
25.0000 g | Freq: Once | INTRAVENOUS | Status: AC
  Administered 2021-03-14: 25 g via INTRAVENOUS
  Filled 2021-03-14: qty 100

## 2021-03-14 MED ORDER — OXYCODONE HCL 5 MG PO TABS
5.0000 mg | ORAL_TABLET | Freq: Four times a day (QID) | ORAL | Status: DC | PRN
  Administered 2021-03-14 – 2021-03-16 (×5): 5 mg via ORAL
  Filled 2021-03-14 (×5): qty 1

## 2021-03-14 MED ORDER — FUROSEMIDE 40 MG PO TABS
60.0000 mg | ORAL_TABLET | Freq: Once | ORAL | Status: AC
  Administered 2021-03-15: 60 mg via ORAL
  Filled 2021-03-14: qty 1

## 2021-03-14 MED ORDER — SPIRONOLACTONE 25 MG PO TABS
50.0000 mg | ORAL_TABLET | Freq: Every day | ORAL | Status: DC
  Administered 2021-03-15: 50 mg via ORAL
  Filled 2021-03-14: qty 2

## 2021-03-14 NOTE — Progress Notes (Signed)
[facility] Progress Note  [name]    XBD:[id number]  DOA: 03/13/2021     Date of Service: the patient was seen and examined on 03/14/2021  Chief Complaint  Patient presents with   Fall   Brief hospital course: April Blake is a 80 y.o. 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   ED w/up: Na 135, K 4.5, Cl 104, bicarb 21, glucose 116, BUN 38, Cr 1.42, calcium 8.0, alkaline phosphatase 48, albumin 1.9, AST 24, ALT 10, total protein 5.0, white count 5.4, hemoglobin 6.4, hematocrit 21.9, MCV 100, RDW 19.0, platelet count 150 CT scan of cervical spine without contrast shows no acute traumatic injury.  Layering pleural effusion in the left lung appears to be at least moderate in volume. CT scan of the head without contrast showed broad-based forehead scalp hematoma without underlying skull fracture.  Advanced cerebral white matter changes. Chest x-ray reviewed by me shows pulmonary vascular congestion.  Stable small left pleural effusion. X-ray of right elbow shows impacted fracture of the distal right humerus involving the trochlea and olecranon fossa. Right forearm x-ray shows abnormal distal right humerus.  No acute fracture or dislocation in the right forearm. Right hand x-ray shows no acute fracture or dislocation in the right hand.  Osteoarthritis.  Chronic ulnar styloid fracture. X-ray of the right humerus shows a chronic right midshaft humeral fracture. Left knee x-ray shows no acute fracture or dislocation.  Osteopenia with advanced degeneration,  severe in the left lateral compartment.   ED Course: Patient is a 80 year old female with a history of ongoing blood loss from GAVE requiring frequent blood transfusions who presents to the ER for evaluation following a witnessed fall at home. X-ray of the elbow shows an impacted fracture of the distal right humerus involving the trochlea and olecranon fossa. Orthopedic surgery was consulted by the ER She will be referred to observation status for further evaluation.   Assessment and Plan: Principal Problem:   Symptomatic anemia Active Problems:   Liver cirrhosis secondary to NASH (HCC)   GAVE (gastric antral vascular ectasia)   Hypothyroidism   Acute on chronic blood loss anemia   CKD stage 3 due to type 2 diabetes mellitus (HCC)      Acute on chronic blood loss anemia Secondary to ongoing GI blood loss from GAVE (gastric antral vascular ectasia) Hemoglobin is 6.4g/dl Will transfuse 2 units of packed RBC Palliative care has seen the patient and family is open to hospice evaluation Hb 7.3--7.5 after transfusion        Status post fall with right humerus fracture Immobilize right upper extremity Pain control Consult orthopedic surgery, recommended nonweightbearing RUE with splint placement and sling for comfort.  Patient may eventually benefit from surgery for the right elbow but need outpost specialist given the fracture, can be performed as an outpatient.  May follow-up with orthopedic surgery at [facility] as she has several chronic medical issues and high risk.     Diabetes mellitus with complications of stage III chronic kidney disease Maintain consistent carbohydrate diet Continue long-acting insulin Glycemic control with sliding scale coverage Renal function is stable  Liver cirrhosis secondary to NASH (HCC) Continue propranolol, Lasix and Aldactone Maintain low-sodium diet    Hypothyroidism Continue levothyroxine    Uterine cancer ([place]) Followed locally by Dr.  Grayland Ormond  COVID-19 viral infection diagnosed on 01/21/2021 Currently patient is still positive, no isolation required as patient had infection within the last 90 days  Bilateral lower extremity edema could be secondary to NASH cirrhosis and lymphedema due to immobilization Continue Ace wraps and keep legs elevated  Body mass index is 36.32 kg/m.  Interventions:     Follow PT and OT eval for disposition plan  Palliative care consulted for goals of care discussion, patient may qualify for hospice care.  Diet: Carb modified DVT Prophylaxis: SCD, pharmacological prophylaxis contraindicated due to GI bleeding    Advance goals of care discussion: DNR  Family Communication: family was present at bedside, at the time of interview.  The pt provided permission to discuss medical plan with the family. Opportunity was given to ask question and all questions were answered satisfactorily.   Disposition:  Pt is from Home, admitted with anemia due to GI bleeding, still has risk of bleeding and may need transfusion, which precludes a safe discharge. Discharge to home with home health, pending PT and OT eval, when clinically stable, may require 1 to 2 days more.  Subjective: No significant overnight events, patient was complaining of pain in the right wrist due to fracture, patient denied any other active issues, no shortness of breath, no chest pain or palpitations.  Physical Exam: General:  NAD, laying in bed Appear in mild distress, affect appropriate Eyes: PERRLA ENT: Oral Mucosa Clear, moist  Neck: no JVD,  Cardiovascular: S1 and S2 Present, no Murmur,  Respiratory: good respiratory effort, Bilateral Air entry equal and Decreased, no Crackles, no wheezes Abdomen: Bowel Sound present, Soft and no tenderness,  Skin: no rashes Extremities: no Pedal edema, no calf tenderness Neurologic: without any new focal findings Gait not checked due to patient safety concerns  Vitals:   03/13/21  2028 03/14/21 0435 03/14/21 0753 03/14/21 1356  BP: (!) 110/45 (!) 118/54 (!) 115/52   Pulse: 72 73 68   Resp: 20 (!) 22 20   Temp: 97.6 F (36.4 C) 97.8 F (36.6 C) 99.3 F (37.4 C)   TempSrc: Oral Oral Oral   SpO2: 97% 98% 99% 96%  Weight:      Height:        Intake/Output Summary (Last 24 hours) at 03/14/2021 1557 Last data filed at 03/14/2021 1410 Gross per 24 hour  Intake 408 ml  Output 200 ml  Net 208 ml   Filed Weights   03/13/21 0522  Weight: 93 kg    Data Reviewed: I have personally reviewed and interpreted daily labs, tele strips, imagings as discussed above. I reviewed all nursing notes, pharmacy notes, vitals, pertinent old records I have discussed plan of care as described above with RN and patient/family.  CBC: Recent Labs  Lab 03/12/21 1003 03/13/21 0604 03/13/21 1857 03/14/21 0705 03/14/21 1352  WBC 4.5 5.4  --  3.7*  --   NEUTROABS 3.5 4.1  --   --   --   HGB 7.2* 6.4* 7.9* 7.3* 7.5*  HCT 23.5* 21.9* 24.5* 22.8* 23.0*  MCV 94.8 100.0  --  89.1  --   PLT 131* 150  --  133*  --    Basic Metabolic Panel: Recent Labs  Lab 03/12/21 1003 03/13/21 0604 03/13/21 0917 03/14/21 0705  NA 135 135  --  133*  K 4.0 4.5  --  4.3  CL 100 104  --  100  CO2 26 21*  --  26  GLUCOSE 165* 116*  --  122*  BUN 36* 38*  --  40*  CREATININE 1.52* 1.42*  --  1.56*  CALCIUM 8.1* 8.0*  --  7.8*  MG  --   --  1.8  --     Studies: No results found.  Scheduled Meds:  ferrous sulfate  325 mg Oral BID WC   furosemide  40 mg Oral Daily   insulin aspart  0-15 Units Subcutaneous TID WC   insulin glargine-yfgn  20 Units Subcutaneous Q2200   levothyroxine  75 mcg Oral Q0600   pantoprazole (PROTONIX) IV  40 mg Intravenous Q12H   simvastatin  20 mg Oral QHS   [START ON 03/15/2021] spironolactone  50 mg Oral Daily   vitamin B-12  1,000 mcg Oral Daily   Continuous Infusions:  methocarbamol (ROBAXIN) IV     PRN Meds: acetaminophen, diclofenac Sodium,  ipratropium-albuterol, methocarbamol (ROBAXIN) IV, morphine injection, oxyCODONE  Time spent: 35 minutes  Author: Val Riles. MD [facility] 03/14/2021 3:57 PM  To reach On-call, see care teams to locate the attending and reach out to them via [email or web address]. If 7PM-7AM, please contact night-coverage If you still have difficulty reaching the attending provider, please page the [facility] (Director on Call) for [facility] on amion for assistance.

## 2021-03-15 DIAGNOSIS — K767 Hepatorenal syndrome: Secondary | ICD-10-CM | POA: Diagnosis present

## 2021-03-15 DIAGNOSIS — E039 Hypothyroidism, unspecified: Secondary | ICD-10-CM | POA: Diagnosis present

## 2021-03-15 DIAGNOSIS — W1839XA Other fall on same level, initial encounter: Secondary | ICD-10-CM | POA: Diagnosis present

## 2021-03-15 DIAGNOSIS — M19041 Primary osteoarthritis, right hand: Secondary | ICD-10-CM | POA: Diagnosis present

## 2021-03-15 DIAGNOSIS — E1122 Type 2 diabetes mellitus with diabetic chronic kidney disease: Secondary | ICD-10-CM | POA: Diagnosis present

## 2021-03-15 DIAGNOSIS — S0003XA Contusion of scalp, initial encounter: Secondary | ICD-10-CM | POA: Diagnosis present

## 2021-03-15 DIAGNOSIS — Z23 Encounter for immunization: Secondary | ICD-10-CM | POA: Diagnosis present

## 2021-03-15 DIAGNOSIS — E785 Hyperlipidemia, unspecified: Secondary | ICD-10-CM | POA: Diagnosis present

## 2021-03-15 DIAGNOSIS — Y92009 Unspecified place in unspecified non-institutional (private) residence as the place of occurrence of the external cause: Secondary | ICD-10-CM | POA: Diagnosis not present

## 2021-03-15 DIAGNOSIS — K7460 Unspecified cirrhosis of liver: Secondary | ICD-10-CM | POA: Diagnosis present

## 2021-03-15 DIAGNOSIS — I129 Hypertensive chronic kidney disease with stage 1 through stage 4 chronic kidney disease, or unspecified chronic kidney disease: Secondary | ICD-10-CM | POA: Diagnosis present

## 2021-03-15 DIAGNOSIS — K3189 Other diseases of stomach and duodenum: Secondary | ICD-10-CM | POA: Diagnosis present

## 2021-03-15 DIAGNOSIS — K7581 Nonalcoholic steatohepatitis (NASH): Secondary | ICD-10-CM | POA: Diagnosis present

## 2021-03-15 DIAGNOSIS — D649 Anemia, unspecified: Secondary | ICD-10-CM | POA: Diagnosis not present

## 2021-03-15 DIAGNOSIS — Z8616 Personal history of COVID-19: Secondary | ICD-10-CM | POA: Diagnosis not present

## 2021-03-15 DIAGNOSIS — D62 Acute posthemorrhagic anemia: Secondary | ICD-10-CM | POA: Diagnosis present

## 2021-03-15 DIAGNOSIS — K766 Portal hypertension: Secondary | ICD-10-CM | POA: Diagnosis present

## 2021-03-15 DIAGNOSIS — Z85828 Personal history of other malignant neoplasm of skin: Secondary | ICD-10-CM | POA: Diagnosis not present

## 2021-03-15 DIAGNOSIS — N1832 Chronic kidney disease, stage 3b: Secondary | ICD-10-CM | POA: Diagnosis present

## 2021-03-15 DIAGNOSIS — K922 Gastrointestinal hemorrhage, unspecified: Secondary | ICD-10-CM | POA: Diagnosis present

## 2021-03-15 DIAGNOSIS — J90 Pleural effusion, not elsewhere classified: Secondary | ICD-10-CM | POA: Diagnosis present

## 2021-03-15 DIAGNOSIS — J811 Chronic pulmonary edema: Secondary | ICD-10-CM | POA: Diagnosis present

## 2021-03-15 DIAGNOSIS — K31811 Angiodysplasia of stomach and duodenum with bleeding: Secondary | ICD-10-CM | POA: Diagnosis present

## 2021-03-15 DIAGNOSIS — R9431 Abnormal electrocardiogram [ECG] [EKG]: Secondary | ICD-10-CM | POA: Diagnosis present

## 2021-03-15 DIAGNOSIS — Z66 Do not resuscitate: Secondary | ICD-10-CM | POA: Diagnosis present

## 2021-03-15 DIAGNOSIS — S51811A Laceration without foreign body of right forearm, initial encounter: Secondary | ICD-10-CM | POA: Diagnosis present

## 2021-03-15 DIAGNOSIS — N179 Acute kidney failure, unspecified: Secondary | ICD-10-CM | POA: Diagnosis present

## 2021-03-15 DIAGNOSIS — S42401A Unspecified fracture of lower end of right humerus, initial encounter for closed fracture: Secondary | ICD-10-CM | POA: Diagnosis present

## 2021-03-15 LAB — BASIC METABOLIC PANEL
Anion gap: 6 (ref 5–15)
BUN: 43 mg/dL — ABNORMAL HIGH (ref 8–23)
CO2: 26 mmol/L (ref 22–32)
Calcium: 7.9 mg/dL — ABNORMAL LOW (ref 8.9–10.3)
Chloride: 97 mmol/L — ABNORMAL LOW (ref 98–111)
Creatinine, Ser: 1.86 mg/dL — ABNORMAL HIGH (ref 0.44–1.00)
GFR, Estimated: 27 mL/min — ABNORMAL LOW (ref >60)
Glucose, Bld: 167 mg/dL — ABNORMAL HIGH (ref 70–99)
Potassium: 4.8 mmol/L (ref 3.5–5.1)
Sodium: 129 mmol/L — ABNORMAL LOW (ref 135–145)

## 2021-03-15 LAB — HEMOGLOBIN AND HEMATOCRIT, BLOOD
HCT: 23.8 % — ABNORMAL LOW (ref 36.0–46.0)
HCT: 23.9 % — ABNORMAL LOW (ref 36.0–46.0)
HCT: 24.3 % — ABNORMAL LOW (ref 36.0–46.0)
Hemoglobin: 7.7 g/dL — ABNORMAL LOW (ref 12.0–15.0)
Hemoglobin: 7.8 g/dL — ABNORMAL LOW (ref 12.0–15.0)
Hemoglobin: 7.9 g/dL — ABNORMAL LOW (ref 12.0–15.0)

## 2021-03-15 LAB — CBC
HCT: 20.6 % — ABNORMAL LOW (ref 36.0–46.0)
Hemoglobin: 6.7 g/dL — ABNORMAL LOW (ref 12.0–15.0)
MCH: 29.4 pg (ref 26.0–34.0)
MCHC: 32.5 g/dL (ref 30.0–36.0)
MCV: 90.4 fL (ref 80.0–100.0)
Platelets: 123 10*3/uL — ABNORMAL LOW (ref 150–400)
RBC: 2.28 MIL/uL — ABNORMAL LOW (ref 3.87–5.11)
RDW: 18.4 % — ABNORMAL HIGH (ref 11.5–15.5)
WBC: 4.1 10*3/uL (ref 4.0–10.5)
nRBC: 0 % (ref 0.0–0.2)

## 2021-03-15 LAB — HEPATIC FUNCTION PANEL
ALT: 11 U/L (ref 0–44)
AST: 17 U/L (ref 15–41)
Albumin: 2.3 g/dL — ABNORMAL LOW (ref 3.5–5.0)
Alkaline Phosphatase: 52 U/L (ref 38–126)
Bilirubin, Direct: 0.3 mg/dL — ABNORMAL HIGH (ref 0.0–0.2)
Indirect Bilirubin: 1 mg/dL — ABNORMAL HIGH (ref 0.3–0.9)
Total Bilirubin: 1.3 mg/dL — ABNORMAL HIGH (ref 0.3–1.2)
Total Protein: 4.6 g/dL — ABNORMAL LOW (ref 6.5–8.1)

## 2021-03-15 LAB — PREPARE RBC (CROSSMATCH)

## 2021-03-15 LAB — GLUCOSE, CAPILLARY
Glucose-Capillary: 163 mg/dL — ABNORMAL HIGH (ref 70–99)
Glucose-Capillary: 202 mg/dL — ABNORMAL HIGH (ref 70–99)
Glucose-Capillary: 213 mg/dL — ABNORMAL HIGH (ref 70–99)
Glucose-Capillary: 192 mg/dL — ABNORMAL HIGH (ref 70–99)

## 2021-03-15 LAB — AMMONIA: Ammonia: 42 umol/L — ABNORMAL HIGH (ref 9–35)

## 2021-03-15 LAB — MAGNESIUM: Magnesium: 1.7 mg/dL (ref 1.7–2.4)

## 2021-03-15 LAB — PHOSPHORUS: Phosphorus: 3.6 mg/dL (ref 2.5–4.6)

## 2021-03-15 MED ORDER — SODIUM CHLORIDE 0.9% IV SOLUTION: Freq: Once | INTRAVENOUS | Status: AC

## 2021-03-15 NOTE — Progress Notes (Signed)
[facility] Progress Note  [name]    RCV:[id number]  DOA: 03/13/2021     Date of Service: the patient was seen and examined on 03/15/2021  Chief Complaint  Patient presents with   Fall   Brief hospital course: April Blake is a 80 y.o. 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   ED w/up: Na 135, K 4.5, Cl 104, bicarb 21, glucose 116, BUN 38, Cr 1.42, calcium 8.0, alkaline phosphatase 48, albumin 1.9, AST 24, ALT 10, total protein 5.0, white count 5.4, hemoglobin 6.4, hematocrit 21.9, MCV 100, RDW 19.0, platelet count 150 CT scan of cervical spine without contrast shows no acute traumatic injury.  Layering pleural effusion in the left lung appears to be at least moderate in volume. CT scan of the head without contrast showed broad-based forehead scalp hematoma without underlying skull fracture.  Advanced cerebral white matter changes. Chest x-ray reviewed by me shows pulmonary vascular congestion.  Stable small left pleural effusion. X-ray of right elbow shows impacted fracture of the distal right humerus involving the trochlea and olecranon fossa. Right forearm x-ray shows abnormal distal right humerus.  No acute fracture or dislocation in the right forearm. Right hand x-ray shows no acute fracture or dislocation in the right hand.  Osteoarthritis.  Chronic ulnar styloid fracture. X-ray of the right humerus shows a chronic right midshaft humeral fracture. Left knee x-ray shows no acute fracture or dislocation.  Osteopenia with advanced degeneration,  severe in the left lateral compartment.   ED Course: Patient is a 80 year old female with a history of ongoing blood loss from GAVE requiring frequent blood transfusions who presents to the ER for evaluation following a witnessed fall at home. X-ray of the elbow shows an impacted fracture of the distal right humerus involving the trochlea and olecranon fossa. Orthopedic surgery was consulted by the ER She will be referred to observation status for further evaluation.   Assessment and Plan: Principal Problem:   Symptomatic anemia Active Problems:   Liver cirrhosis secondary to NASH (HCC)   GAVE (gastric antral vascular ectasia)   Hypothyroidism   Acute on chronic blood loss anemia   CKD stage 3 due to type 2 diabetes mellitus (HCC)      Acute on chronic blood loss anemia Secondary to ongoing GI blood loss from GAVE (gastric antral vascular ectasia) Hemoglobin is 6.4g/dl Will transfuse 2 units of packed RBC Palliative care has seen the patient and family is open to hospice evaluation Hb 7.3--7.5 after transfusion    12/11 Hb 6.7  transfuse 1U PRBC, repeat hemoglobin 7.7, transfused 1 more unit of PRBC to keep hemoglobin above 8 due to persistent GI bleeding. Discussed with patient's daughters at bedside who agreed with the blood transfusion to keep hemoglobin above 8 due to persistent GI bleeding.   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 Goals of care discussed and proceeding with hospice care  Status post fall with right humerus fracture Immobilize right upper extremity Pain control Consult orthopedic surgery, recommended nonweightbearing RUE with splint placement and sling  for comfort.  Patient may eventually benefit from surgery for the right elbow but need outpost specialist given the fracture, can be performed as an outpatient.  May follow-up with orthopedic surgery at [facility] as she has several  chronic medical issues and high risk.     Diabetes mellitus with complications of stage III chronic kidney disease Maintain consistent carbohydrate diet Continue long-acting insulin Glycemic control with sliding scale coverage Renal function is stable     Liver cirrhosis secondary to NASH (HCC) Continue propranolol, Lasix and Aldactone Maintain low-sodium diet    Hypothyroidism Continue levothyroxine    Uterine cancer ([place]) Followed locally by Dr. Grayland Blake  COVID-19 viral infection diagnosed on 01/21/2021 Currently patient is still positive, no isolation required as patient had infection within the last 90 days  Bilateral lower extremity edema could be secondary to NASH cirrhosis and lymphedema due to immobilization Continue Ace wraps and keep legs elevated  Body mass index is 36.32 kg/m.  Interventions:     Follow PT and OT eval for disposition plan  Palliative care consulted   12/11 goals of care discussed with patient's daughters at bedside, due to poor prognosis family agreed with hospice care.  Hospice team consulted for placement in the hospice facility.  Patient can be discharged when bed will be available.    Diet: Carb modified DVT Prophylaxis: SCD, pharmacological prophylaxis contraindicated due to GI bleeding    Advance goals of care discussion: DNR  Family Communication: family was present at bedside, at the time of interview.  The pt provided permission to discuss medical plan with the family. Opportunity was given to ask question and all questions were answered satisfactorily.   Disposition:  Pt is from Home, admitted with anemia due to GI bleeding, received multiple transfusions.  Due to poor prognosis goals of care discussed with family and they agreed with the hospice placement.  TOC was consulted to reach out hospice team for placement  Discharge to hospice facility when bed will be available.     Subjective: No significant overnight events,  patient still feels weak and tired, pain is under control.  Mild shortness of breath, denied any chest palpitations. Patient still having low hemoglobin and required multiple blood transfusions. Management plan discussed with patient's family at bedside, agreed with the blood transfusion and hospice placement tomorrow a.m.   Physical Exam: General:  NAD, laying in bed Appear in mild distress, affect appropriate Eyes: PERRLA ENT: Oral Mucosa Clear, moist  Neck: no JVD,  Cardiovascular: S1 and S2 Present, no Murmur,  Respiratory: good respiratory effort, Bilateral Air entry equal and Decreased, no Crackles, no wheezes Abdomen: Bowel Sound present, Soft and no tenderness,  Skin: no rashes Extremities: no Pedal edema, no calf tenderness, right arm brace/splint intact Neurologic: without any new focal findings Gait not checked due to patient safety concerns  Vitals:   03/15/21 0737 03/15/21 1040 03/15/21 1059 03/15/21 1350  BP: (!) 118/43 (!) 107/47 (!) 107/42 (!) 111/46  Pulse: 80 80 73 74  Resp: 16 19 18 18   Temp: 98.4 F (36.9 C) 98.1 F (36.7 C) 97.9 F (36.6 C) (!) 97.5 F (36.4 C)  TempSrc: Oral Oral Oral Oral  SpO2: 95% 97% 96% 98%  Weight:      Height:        Intake/Output Summary (Last 24 hours) at 03/15/2021 1458 Last data filed at 03/15/2021 1413 Gross per 24 hour  Intake 1637.5 ml  Output 1800 ml  Net -162.5 ml   Danley Danker  Weights   03/13/21 0522  Weight: 93 kg    Data Reviewed: I have personally reviewed and interpreted daily labs, tele strips, imagings as discussed above. I reviewed all nursing notes, pharmacy notes, vitals, pertinent old records I have discussed plan of care as described above with RN and patient/family.  CBC: Recent Labs  Lab 03/12/21 1003 03/13/21 0604 03/13/21 1857 03/14/21 0705 03/14/21 1352 03/14/21 2202 03/15/21 0520 03/15/21 1404  WBC 4.5 5.4  --  3.7*  --   --  4.1  --   NEUTROABS 3.5 4.1  --   --   --   --   --   --    HGB 7.2* 6.4*   < > 7.3* 7.5* 7.6* 6.7* 7.7*  HCT 23.5* 21.9*   < > 22.8* 23.0* 23.3* 20.6* 23.8*  MCV 94.8 100.0  --  89.1  --   --  90.4  --   PLT 131* 150  --  133*  --   --  123*  --    < > = values in this interval not displayed.   Basic Metabolic Panel: Recent Labs  Lab 03/12/21 1003 03/13/21 0604 03/13/21 0917 03/14/21 0705 03/15/21 0520  NA 135 135  --  133* 129*  K 4.0 4.5  --  4.3 4.8  CL 100 104  --  100 97*  CO2 26 21*  --  26 26  GLUCOSE 165* 116*  --  122* 167*  BUN 36* 38*  --  40* 43*  CREATININE 1.52* 1.42*  --  1.56* 1.86*  CALCIUM 8.1* 8.0*  --  7.8* 7.9*  MG  --   --  1.8  --  1.7  PHOS  --   --   --   --  3.6    Studies: DG Chest Portable 1 View  Result Date: 03/14/2021 CLINICAL DATA:  Tachycardia EXAM: PORTABLE CHEST 1 VIEW COMPARISON:  03/13/2021 FINDINGS: Single frontal view of the chest demonstrates a stable cardiac silhouette. Worsening volume status, with increased central vascular congestion, progressive interstitial prominence, and enlarging left pleural effusion. Dense left basilar consolidation could reflect airspace disease or atelectasis. No pneumothorax. No acute bony abnormalities. IMPRESSION: 1. Worsening volume status, with interval development of mild pulmonary edema and enlarging left pleural effusion. Electronically Signed   By: Randa Ngo M.D.   On: 03/14/2021 22:20    Scheduled Meds:  sodium chloride   Intravenous Once   ferrous sulfate  325 mg Oral BID WC   insulin aspart  0-15 Units Subcutaneous TID WC   insulin glargine-yfgn  20 Units Subcutaneous Q2200   levothyroxine  75 mcg Oral Q0600   pantoprazole (PROTONIX) IV  40 mg Intravenous Q12H   simvastatin  20 mg Oral QHS   vitamin B-12  1,000 mcg Oral Daily   Continuous Infusions:  methocarbamol (ROBAXIN) IV     PRN Meds: acetaminophen, diclofenac Sodium, ipratropium-albuterol, methocarbamol (ROBAXIN) IV, morphine injection, oxyCODONE  Time spent: 35  minutes  Author: Val Riles. MD [facility] 03/15/2021 2:58 PM  To reach On-call, see care teams to locate the attending and reach out to them via [email or web address]. If 7PM-7AM, please contact night-coverage If you still have difficulty reaching the attending provider, please page the [facility] (Director on Call) for [facility] on amion for assistance.

## 2021-03-15 NOTE — Progress Notes (Signed)
Name: Timiyah Romito MRN: [id number] DOB: 1940-11-08     Subjective: Secure chat received from RN stating patient is now a MEWs red due to heart reate of 144 and BP of 95/60. No signs of bleeding.    Objective:  Vitals with BMI 03/14/2021 03/14/2021 03/14/2021  Height - - -  Weight - - -  BMI - - -  Systolic 95 601 093  Diastolic 60 64 46  Pulse 235 145 74    Results for orders placed or performed during the hospital encounter of 03/13/21 (from the past 24 hour(s))  CBC     Status: Abnormal   Collection Time: 03/14/21  7:05 AM  Result Value Ref Range   WBC 3.7 (L) 4.0 - 10.5 K/uL   RBC 2.56 (L) 3.87 - 5.11 MIL/uL   Hemoglobin 7.3 (L) 12.0 - 15.0 g/dL   HCT 22.8 (L) 36.0 - 46.0 %   MCV 89.1 80.0 - 100.0 fL   MCH 28.5 26.0 - 34.0 pg   MCHC 32.0 30.0 - 36.0 g/dL   RDW 19.0 (H) 11.5 - 15.5 %   Platelets 133 (L) 150 - 400 K/uL   nRBC 0.0 0.0 - 0.2 %  Basic metabolic panel     Status: Abnormal   Collection Time: 03/14/21  7:05 AM  Result Value Ref Range   Sodium 133 (L) 135 - 145 mmol/L   Potassium 4.3 3.5 - 5.1 mmol/L   Chloride 100 98 - 111 mmol/L   CO2 26 22 - 32 mmol/L   Glucose, Bld 122 (H) 70 - 99 mg/dL   BUN 40 (H) 8 - 23 mg/dL   Creatinine, Ser 1.56 (H) 0.44 - 1.00 mg/dL   Calcium 7.8 (L) 8.9 - 10.3 mg/dL   GFR, Estimated 34 (L) >60 mL/min   Anion gap 7 5 - 15  Glucose, capillary     Status: Abnormal   Collection Time: 03/14/21  7:48 AM  Result Value Ref Range   Glucose-Capillary 118 (H) 70 - 99 mg/dL   Comment 1 Notify RN   Glucose, capillary     Status: Abnormal   Collection Time: 03/14/21 11:51 AM  Result Value Ref Range   Glucose-Capillary 187 (H) 70 - 99 mg/dL   Comment 1 Notify RN   Hemoglobin and hematocrit, blood     Status: Abnormal   Collection Time: 03/14/21  1:52 PM  Result Value Ref Range   Hemoglobin 7.5 (L) 12.0 - 15.0 g/dL   HCT 23.0 (L) 36.0 - 46.0 %  Glucose, capillary     Status: Abnormal   Collection Time: 03/14/21  4:30 PM  Result  Value Ref Range   Glucose-Capillary 191 (H) 70 - 99 mg/dL  Glucose, capillary     Status: Abnormal   Collection Time: 03/14/21  8:59 PM  Result Value Ref Range   Glucose-Capillary 179 (H) 70 - 99 mg/dL  Hemoglobin and hematocrit, blood     Status: Abnormal   Collection Time: 03/14/21 10:02 PM  Result Value Ref Range   Hemoglobin 7.6 (L) 12.0 - 15.0 g/dL   HCT 23.3 (L) 36.0 - 46.0 %      Assessment:   General: Adult female lying in bed in no acute distress HEENT: MM pink/moist, Ecchymosis below (L) eye, neck supple Neuro: A&O x 4, follows commands, PERRL  CV: S1S2, RRR, no r/m/g Pulm: Regular, non labored on room air , breath sounds clear throughout Extremities: warm/dry, pulses + 2 R/P, (R) arm  in elastic compression dressing   Plan:   1. Tachycardia - 12 lead EKG - Chest Xray  2. Chronic blood loss anemia - Hemoglobin and Hematocrit lab  3. Pulmonary Edema  - 60 mg PO Lasix - 25g IV Albumin

## 2021-03-15 NOTE — Progress Notes (Signed)
OT Cancellation Note  Patient Details Name: April Blake MRN: [id number] DOB: 10/10/1940   Cancelled Treatment:    Reason Eval/Treat Not Completed: Medical issues which prohibited therapy;Other (comment) (per secure chat with Dr. Dwyane Dee, pt to receive blood this morning and can be seen by PT/OT after 2 pm of repeat CBC. Will hold at this time and follow up for OT eval as able.Shanon Payor, OTD OTR/L  03/15/21, 9:44 AM

## 2021-03-15 NOTE — Progress Notes (Addendum)
Patients RBC unit finished today at 1350. Lab came and drew the H & H about 10 mins after unit completion. The 1400 H&H is 7.7. Notified Dileep Dwyane Dee to make him aware, he reordered for a H&H check around 1600. Verbal orders given for if hemoglobin is less than 8 to continue with transfusion transfuse.

## 2021-03-15 NOTE — TOC Initial Note (Signed)
Transition of Care [facility]) - Initial/Assessment Note    Patient Details  Name: April Blake MRN: [id number] Date of Birth: [birth year]  Transition of [facility]) CM/SW Contact:    Harriet Masson, RN Phone Number: 03/15/2021, 12:47 PM  Clinical Narrative:                  Received a request for hospice center. RN spoke with POA daughter Mariajose Mow who as agreed to [employer]. RN spoke with hospice Horris Latino) who has requested a referral for consideration for placement. RN has requested this referral from Dr. Dwyane Dee for the official referral for hospice placement.  Will continue to follow pt for discharge needs accordingly.  Expected Discharge Plan:  (Hospice) Barriers to Discharge: Continued Medical Work up   Patient Goals and CMS Choice     Choice offered to / list presented to : Adult Children  Expected Discharge Plan and Services Expected Discharge Plan:  (Hospice)   Discharge Planning Services: CM Consult Post Acute Care Choice: Residential Hospice Bed Living arrangements for the past 2 months: Single Family Home                                      Prior Living Arrangements/Services Living arrangements for the past 2 months: Single Family Home Lives with:: Adult Children Patient language and need for interpreter reviewed:: Yes Do you feel safe going back to the place where you live?: No   Pt total with need for hospice care for comfort measures  Need for Family Participation in Patient Care: Yes (Comment) Care giver support system in place?: Yes (comment)   Criminal Activity/Legal Involvement Pertinent to Current Situation/Hospitalization: No - Comment as needed  Activities of Daily Living Home Assistive Devices/Equipment: Bedside commode/3-in-1 ADL Screening (condition at time of admission) Patient's cognitive ability adequate to safely complete daily activities?: Yes Is the patient deaf or have difficulty hearing?: No Does  the patient have difficulty seeing, even when wearing glasses/contacts?: No Does the patient have difficulty concentrating, remembering, or making decisions?: No Patient able to express need for assistance with ADLs?: Yes Does the patient have difficulty dressing or bathing?: No Independently performs ADLs?: Yes (appropriate for developmental age) Does the patient have difficulty walking or climbing stairs?: No Weakness of Legs: Both Weakness of Arms/Hands: None  Permission Sought/Granted   Permission granted to share information with : Yes, Verbal Permission Granted              Emotional Assessment   Attitude/Demeanor/Rapport: Engaged (POA daughter Ginger Forgue) Affect (typically observed): Accepting (POA daughter Chief Strategy Officer) Orientation: : Oriented to Self, Oriented to Place, Oriented to  Time, Oriented to Situation Alcohol / Substance Use: Not Applicable Psych Involvement: No (comment)  Admission diagnosis:  Prolonged Q-T interval on ECG [R94.31] Fall [W19.XXXA] Injury of head, initial encounter [L97.67HA] Fall, initial encounter [W19.XXXA] Symptomatic anemia [D64.9] Skin tear of right forearm without complication, initial encounter [S51.811A] Closed fracture of distal end of right humerus, unspecified fracture morphology, initial encounter [S42.401A] Upper GI bleeding [K92.2] Patient Active Problem List   Diagnosis Date Noted   Upper GI bleeding 03/15/2021   CKD stage 3 due to type 2 diabetes mellitus ([place]) 03/13/2021   Acute on chronic blood loss anemia 03/02/2021   Symptomatic anemia 03/02/2021   Abnormal gait 09/30/2020   Cardiovascular symptoms 09/30/2020   Chronic kidney disease, stage 3b ([place]) 09/30/2020  Esophageal varices with bleeding ([place]) 56/86/1683   Umbilical hernia 72/90/2111   Fatigue 09/30/2020   Type 2 diabetes with nephropathy ([place]) 09/30/2020   Hyperlipidemia associated with type 2 diabetes mellitus ([place]) 09/30/2020   Impaired mobility  09/30/2020   Insomnia 09/30/2020   Osteoarthritis of knee 09/30/2020   Personal history of colonic polyps 09/30/2020   Chronic blood loss anemia 09/27/2020   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   Uterine cancer ([place]) 04/09/2020   Upper GI bleed 03/16/2018   Bilateral lower extremity edema 02/09/2018   Coronary artery calcification seen on CT scan 12/01/2017   Exertional dyspnea 12/01/2017   Ascites 10/24/2013   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 [place] [address] Phone: [phone] Fax: [phone]     Social Determinants of Health (SDOH) Interventions    Readmission Risk Interventions No flowsheet data found.

## 2021-03-15 NOTE — Progress Notes (Signed)
PT Cancellation Note  Patient Details Name: April Blake MRN: [id number] DOB: September 07, 1940   Cancelled Treatment:    Reason Eval/Treat Not Completed: Other (comment).  Per discussion with family, no PT needs currently at this time as pt is transferring to Palliative Care.  Will sign off at this time.  Please re-consult as needs arise.      Gwenlyn Saran, PT, DPT 03/15/21, 3:07 PM

## 2021-03-16 LAB — BPAM RBC
Blood Product Expiration Date: 202212282359
Blood Product Expiration Date: 202212292359
Blood Product Expiration Date: 202301022359
Blood Product Expiration Date: 202301022359
ISSUE DATE / TIME: 202212091138
ISSUE DATE / TIME: 202212091417
ISSUE DATE / TIME: 202212111033
ISSUE DATE / TIME: 202212112212
Unit Type and Rh: 6200
Unit Type and Rh: 6200
Unit Type and Rh: 6200
Unit Type and Rh: 6200

## 2021-03-16 LAB — TYPE AND SCREEN
ABO/RH(D): A POS
Antibody Screen: NEGATIVE
Unit division: 0
Unit division: 0
Unit division: 0
Unit division: 0

## 2021-03-16 LAB — BASIC METABOLIC PANEL
Anion gap: 6 (ref 5–15)
BUN: 44 mg/dL — ABNORMAL HIGH (ref 8–23)
CO2: 26 mmol/L (ref 22–32)
Calcium: 8.1 mg/dL — ABNORMAL LOW (ref 8.9–10.3)
Chloride: 98 mmol/L (ref 98–111)
Creatinine, Ser: 1.57 mg/dL — ABNORMAL HIGH (ref 0.44–1.00)
GFR, Estimated: 34 mL/min — ABNORMAL LOW (ref >60)
Glucose, Bld: 118 mg/dL — ABNORMAL HIGH (ref 70–99)
Potassium: 4.5 mmol/L (ref 3.5–5.1)
Sodium: 130 mmol/L — ABNORMAL LOW (ref 135–145)

## 2021-03-16 LAB — HEPATIC FUNCTION PANEL
ALT: 12 U/L (ref 0–44)
AST: 16 U/L (ref 15–41)
Albumin: 2.2 g/dL — ABNORMAL LOW (ref 3.5–5.0)
Alkaline Phosphatase: 54 U/L (ref 38–126)
Bilirubin, Direct: 0.4 mg/dL — ABNORMAL HIGH (ref 0.0–0.2)
Indirect Bilirubin: 1.4 mg/dL — ABNORMAL HIGH (ref 0.3–0.9)
Total Bilirubin: 1.8 mg/dL — ABNORMAL HIGH (ref 0.3–1.2)
Total Protein: 5.3 g/dL — ABNORMAL LOW (ref 6.5–8.1)

## 2021-03-16 LAB — CBC
HCT: 27.8 % — ABNORMAL LOW (ref 36.0–46.0)
Hemoglobin: 9.2 g/dL — ABNORMAL LOW (ref 12.0–15.0)
MCH: 29.3 pg (ref 26.0–34.0)
MCHC: 33.1 g/dL (ref 30.0–36.0)
MCV: 88.5 fL (ref 80.0–100.0)
Platelets: 136 10*3/uL — ABNORMAL LOW (ref 150–400)
RBC: 3.14 MIL/uL — ABNORMAL LOW (ref 3.87–5.11)
RDW: 17.9 % — ABNORMAL HIGH (ref 11.5–15.5)
WBC: 4.5 10*3/uL (ref 4.0–10.5)
nRBC: 0 % (ref 0.0–0.2)

## 2021-03-16 LAB — GLUCOSE, CAPILLARY
Glucose-Capillary: 92 mg/dL (ref 70–99)
Glucose-Capillary: 164 mg/dL — ABNORMAL HIGH (ref 70–99)
Glucose-Capillary: 169 mg/dL — ABNORMAL HIGH (ref 70–99)

## 2021-03-16 LAB — PHOSPHORUS: Phosphorus: 3.5 mg/dL (ref 2.5–4.6)

## 2021-03-16 LAB — MAGNESIUM: Magnesium: 1.8 mg/dL (ref 1.7–2.4)

## 2021-03-16 NOTE — TOC Progression Note (Addendum)
Transition of Care [facility]) - Progression Note    Patient Details  Name: Kataleah Bejar MRN: [id number] Date of Birth: February 26, 1941  [facility]) CM/SW Contact  Beverly Sessions, RN Phone Number: 03/16/2021, 10:44 AM  Clinical Narrative:     Message sent to Jolayne Haines Sonia Baller) [place] to follow up on residential hospice referral.  She is to review   Expected Discharge Plan:  (Hospice) Barriers to Discharge: Continued Medical Work up  Expected Discharge Plan and Services Expected Discharge Plan:  [facility])   Discharge Planning Services: CM Consult Post Acute Care Choice: Residential Hospice Bed Living arrangements for the past 2 months: Single Family Home                                       Social Determinants of Health (SDOH) Interventions    Readmission Risk Interventions No flowsheet data found.

## 2021-03-16 NOTE — Discharge Summary (Signed)
[facility] Discharge Summary   [name] HQR:[id number]  PCP: Olin Hauser, DO  Date of admission: 03/13/2021   Date of discharge:  03/16/2021     Discharge Diagnoses:  Principal Problem:   Symptomatic anemia Active Problems:   Liver cirrhosis secondary to NASH ([place])   GAVE (gastric antral vascular ectasia)   Hypothyroidism   Acute on chronic blood loss anemia   CKD stage 3 due to type 2 diabetes mellitus ([place])   Upper GI bleeding   Admitted From: Home Disposition:  Hospice Home   Recommendations for Outpatient Follow-up:  F/u Hospice care for comfort measures  Follow up LABS/TEST:     Diet recommendation: Carb modified diet  Activity: The patient is advised to gradually reintroduce usual activities, as tolerated  Discharge Condition: stable  Code Status: DNR   History of present illness: As per the H and P dictated on admission Hospital Course:  April Blake is a 80 y.o. 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   ED w/up: Na 135, K 4.5, Cl 104, bicarb 21, glucose 116, BUN 38, Cr 1.42, calcium 8.0, alkaline phosphatase 48, albumin 1.9, AST 24, ALT 10, total protein 5.0, white count 5.4, hemoglobin 6.4, hematocrit 21.9, MCV 100, RDW 19.0, platelet count 150 CT scan of cervical spine without contrast shows no acute traumatic injury.  Layering pleural effusion in the left lung appears to be at least moderate in volume. CT scan of the head without contrast showed broad-based forehead scalp hematoma without underlying skull  fracture.  Advanced cerebral white matter changes. Chest x-ray reviewed by me shows pulmonary vascular congestion.  Stable small left pleural effusion. X-ray of right elbow shows impacted fracture of the distal right humerus involving the trochlea and olecranon fossa. Right forearm x-ray shows abnormal distal right humerus.  No acute fracture or dislocation in the right forearm. Right hand x-ray shows no acute fracture or dislocation in the right hand.  Osteoarthritis.  Chronic ulnar styloid fracture. X-ray of the right humerus shows a chronic right midshaft humeral fracture. Left knee x-ray shows no acute fracture or dislocation.  Osteopenia with advanced degeneration, severe in the left lateral compartment.   ED Course: Patient is a 80 year old female with a history of ongoing blood loss from GAVE requiring frequent blood transfusions who presents to the ER for evaluation following a witnessed fall at home. X-ray of the elbow shows an impacted fracture of the distal right humerus involving the trochlea and olecranon fossa. Orthopedic surgery was consulted by the ER She will be referred to observation status for further evaluation.   Assessment and Plan: Acute on chronic blood loss anemia, Secondary to ongoing GI blood loss from GAVE (gastric antral vascular ectasia) Hemoglobin is 6.4g/dl, s/p 2 units of packed RBC. Hb 7.3--7.5 after transfusion. On 12/11 Hb 6.7  transfuse 1U PRBC, repeat hemoglobin 7.7, transfused 1 more unit of PRBC to keep hemoglobin above 8 due to persistent GI bleeding. So patient had another 2 units of blood transfusion yesterday. Discussed with patient's daughters at bedside who agreed with the blood transfusion to keep hemoglobin above 8 due to  persistent GI bleeding.  AKI, most likely hepatorenal syndrome due to liver cirrhosis, Held diuretics. Family is aware of stopping diuretics. Goals of care discussed and proceeding with hospice care Status post fall with right humerus  fracture, Immobilize right upper extremity, Pain control Consult orthopedic surgery, recommended nonweightbearing RUE with splint placement and sling for comfort.  Patient may eventually benefit from surgery for the right elbow but need outpost specialist given the fracture, can be performed as an outpatient.  May follow-up with orthopedic surgery at [facility] as she has several chronic medical issues and high risk. Diabetes mellitus with complications of stage III chronic kidney disease, Maintain consistent carbohydrate diet, Continue long-acting insulin, Glycemic control with sliding scale coverage Liver cirrhosis secondary to NASH, Held propranolol, Lasix and Aldactone due to AI and soft BP Hypothyroidism, Continue levothyroxine Uterine cancer, pt was followed locally by Dr. Grayland Ormond COVID-19 viral infection diagnosed on 01/21/2021 Currently patient is still positive, no isolation required as patient had infection within the last 90 days Bilateral lower extremity edema could be secondary to NASH cirrhosis and lymphedema due to immobilization, Continue Ace wraps and keep legs elevated Body mass index is 36.32 kg/m.  Nutrition Interventions:   Patient was seen by physical therapy, who recommended SNF, plan to dc hospice facility. On the day of the discharge the patient's vitals were stable, and no other acute medical condition were reported by patient. the patient was felt safe to be discharge at [facility] facility as per family.  Consultants: Orthopedics, palliative care and hospice team Procedures: Right arm splint  Discharge Exam: General: Appear in mild distress, no Rash; Oral Mucosa Clear, moist. Cardiovascular: S1 and S2 Present, no Murmur, Respiratory: normal respiratory effort, Bilateral Air entry present and mild Crackles, no wheezes Abdomen: Bowel Sound present, Soft and no tenderness, no hernia Extremities: 2-3+ Pedal edema, no calf tenderness, right arm splinted due to fracture, mild  edema due to splint.  Keep arm elevated Neurology: alert and oriented to time, place, and person affect anxious.  Filed Weights   03/13/21 0522  Weight: 93 kg   Vitals:   03/16/21 0414 03/16/21 0803  BP: (!) 114/52 (!) 111/45  Pulse: 77 74  Resp: 16 16  Temp: 98.2 F (36.8 C) 98.2 F (36.8 C)  SpO2: 95% 95%    DISCHARGE MEDICATION: Allergies as of 03/16/2021   No Known Allergies      Medication List     STOP taking these medications    furosemide 20 MG tablet Commonly known as: LASIX   propranolol 10 MG tablet Commonly known as: INDERAL   simvastatin 20 MG tablet Commonly known as: ZOCOR   spironolactone 50 MG tablet Commonly known as: ALDACTONE       TAKE these medications    acetaminophen 500 MG tablet Commonly known as: TYLENOL Take 1,000 mg by mouth every 4 (four) hours as needed for mild pain.   diclofenac Sodium 1 % Gel Commonly known as: Voltaren Apply 2 g topically 4 (four) times daily as needed (osteoarthritis knee pain).   ferrous sulfate 325 (65 FE) MG tablet Take 1 tablet (325 mg total) by mouth 2 (two) times daily with a meal.   insulin regular 100 units/mL injection Commonly known as: NOVOLIN R Inject 2-12 Units into the skin 2 (two) times daily before a meal. (Based upon blood glucose result)   ipratropium-albuterol 0.5-2.5 (3) MG/3ML Soln Commonly known as: DUONEB Take 3 mLs by nebulization 4 (four) times daily as needed (shortness of breath/wheexing).  Lantus SoloStar 100 UNIT/ML Solostar Pen Generic drug: insulin glargine Inject 16 Units into the skin daily.   levothyroxine 75 MCG tablet Commonly known as: SYNTHROID 75 mcg.   omeprazole 40 MG capsule Commonly known as: PRILOSEC Take 1 capsule (40 mg total) by mouth 2 (two) times daily before a meal.   vitamin B-12 1000 MCG tablet Commonly known as: CYANOCOBALAMIN Take 1 tablet (1,000 mcg total) by mouth daily.       No Known Allergies Discharge Instructions      Diet - low sodium heart healthy   Complete by: As directed    Discharge instructions   Complete by: As directed    Follow hospice for comfort appears   Increase activity slowly   Complete by: As directed        The results of significant diagnostics from this hospitalization (including imaging, microbiology, ancillary and laboratory) are listed below for reference.    Significant Diagnostic Studies: DG Chest 2 View  Result Date: 03/01/2021 CLINICAL DATA:  Weakness. EXAM: CHEST - 2 VIEW COMPARISON:  None FINDINGS: Small left pleural effusion and left lower lobe atelectasis or infiltrate. The right lung is clear. No pneumothorax the cardiac silhouette is within limits. No acute osseous pathology. IMPRESSION: Small left pleural effusion and left lower lobe atelectasis or infiltrate. Electronically Signed   By: Anner Crete M.D.   On: 03/01/2021 19:53   DG Elbow 2 Views Right  Result Date: 03/13/2021 CLINICAL DATA:  80 year old female status post witnessed fall. EXAM: RIGHT ELBOW - 2 VIEW COMPARISON:  Right humerus series today. FINDINGS: Abnormal impaction and fragmentation of the distal right humerus involving the trochlea and olecranon fossa. Underlying chronic midshaft humerus fracture noted, but the distal metadiaphysis bone margins appear un healed, acute. However, there is relatively maintained alignment of the proximal radius and ulna which appear to be intact. IMPRESSION: Impacted fracture of the distal right humerus involving the trochlea and olecranon fossa. Burtis Junes this is acute, although a chronic midshaft humerus fracture is noted on the comparison. Relatively maintained alignment of the proximal radius and ulna which appear intact. Electronically Signed   By: Genevie Ann M.D.   On: 03/13/2021 07:34   DG Forearm Right  Result Date: 03/13/2021 CLINICAL DATA:  80 year old female status post witnessed fall. EXAM: RIGHT FOREARM - 2 VIEW COMPARISON:  Right elbow series today. FINDINGS:  Abnormal distal right humerus reported separately. The right radius and ulna appear to remain intact. Chronic ulnar styloid fracture. Relatively maintained alignment at the right wrist. Calcified peripheral vascular disease. No discrete soft tissue injury. IMPRESSION: 1. Abnormal distal right humerus reported separately. 2. No acute fracture or dislocation identified in the right forearm. Electronically Signed   By: Genevie Ann M.D.   On: 03/13/2021 07:35   CT HEAD WO CONTRAST (5MM)  Result Date: 03/13/2021 CLINICAL DATA:  80 year old female status post witnessed fall. EXAM: CT HEAD WITHOUT CONTRAST TECHNIQUE: Contiguous axial images were obtained from the base of the skull through the vertex without intravenous contrast. COMPARISON:  Head CT and Brain MRI 03/01/2021. FINDINGS: Brain: Stable cerebral volume. No midline shift, ventriculomegaly, mass effect, evidence of mass lesion, intracranial hemorrhage or evidence of cortically based acute infarction. Patchy and confluent bilateral white matter hypodensity appears stable from last month. No cortical encephalomalacia identified. Vascular: Extensive Calcified atherosclerosis at the skull base. No suspicious intracranial vascular hyperdensity. Skull: Stable.  No acute osseous abnormality identified. Sinuses/Orbits: Left maxillary sinus mucous retention cyst is stable. Other Visualized paranasal sinuses  and mastoids are stable and well aerated. Other: Broad-based forehead scalp hematoma. No scalp soft tissue gas. Underlying frontal bones appear stable and intact. Orbits soft tissues appears stable. IMPRESSION: 1. Broad-based forehead scalp hematoma without underlying skull fracture. 2. Stable from last month non contrast CT appearance of advanced cerebral white matter changes. Electronically Signed   By: Genevie Ann M.D.   On: 03/13/2021 07:24   CT Head Wo Contrast  Result Date: 03/01/2021 CLINICAL DATA:  Transient ischemic attack EXAM: CT HEAD WITHOUT CONTRAST  TECHNIQUE: Contiguous axial images were obtained from the base of the skull through the vertex without intravenous contrast. COMPARISON:  None. BRAIN: BRAIN Patchy and confluent areas of decreased attenuation are noted throughout the deep and periventricular white matter of the cerebral hemispheres bilaterally, compatible with chronic microvascular ischemic disease. No evidence of large-territorial acute infarction. No parenchymal hemorrhage. No mass lesion. No extra-axial collection. No mass effect or midline shift. No hydrocephalus. Basilar cisterns are patent. Vascular: No hyperdense vessel. Atherosclerotic calcifications are present within the cavernous internal carotid arteries. Skull: No acute fracture or focal lesion. Sinuses/Orbits: Left maxillary mucosal thickening. Otherwise paranasal sinuses and mastoid air cells are clear. Bilateral lens replacement. Otherwise orbits are unremarkable. Other: None. IMPRESSION: No acute intracranial abnormality. Electronically Signed   By: Iven Finn M.D.   On: 03/01/2021 19:24   CT Cervical Spine Wo Contrast  Result Date: 03/13/2021 CLINICAL DATA:  80 year old female status post witnessed fall. EXAM: CT CERVICAL SPINE WITHOUT CONTRAST TECHNIQUE: Multidetector CT imaging of the cervical spine was performed without intravenous contrast. Multiplanar CT image reconstructions were also generated. COMPARISON:  Brain MRI and chest radiographs 03/01/2021. FINDINGS: Alignment: Straightening of cervical lordosis. Bilateral posterior element alignment is within normal limits. Cervicothoracic junction alignment is within normal limits. Skull base and vertebrae: Visualized skull base is intact. No atlanto-occipital dissociation. C1 and C2 appear intact and aligned. No acute osseous abnormality identified. Soft tissues and spinal canal: No prevertebral fluid or swelling. No visible canal hematoma. Negative noncontrast visible neck soft tissues. Disc levels:  Mild for age  cervical spine degeneration. Upper chest: At least moderate layering left pleural effusion, questionable mildly complex fluid density. A small left pleural effusion was present last month. No left pneumothorax or apical lung contusion. Grossly intact visible upper thoracic levels. Other: Absent and carious dentition. IMPRESSION: 1. No acute traumatic injury identified in the cervical spine. 2. Layering pleural effusion in the left lung apex appears to be at least moderate in volume. Recommend follow-up Chest imaging. Electronically Signed   By: Genevie Ann M.D.   On: 03/13/2021 07:28   MR BRAIN WO CONTRAST  Result Date: 03/01/2021 CLINICAL DATA:  Mental status change, unknown cause EXAM: MRI HEAD WITHOUT CONTRAST TECHNIQUE: Multiplanar, multiecho pulse sequences of the brain and surrounding structures were obtained without intravenous contrast. COMPARISON:  No prior MRI, correlation is made with CT head 03/01/2021 FINDINGS: Evaluation is limited by motion artifact. Brain: No restricted diffusion to suggest acute infarction; diffusion-weighted sequences are not as motion limited. No definite acute hemorrhage, mass, mass effect, or midline shift. Confluent T2 hyperintense signal in the periventricular white matter, likely the sequela of severe chronic small vessel ischemic disease. Vascular: Normal flow voids. Skull and upper cervical spine: Unable to evaluate secondary to motion. Sinuses/Orbits: Left maxillary mucous retention cyst. Status post bilateral lens replacements. Other: The mastoids are well aerated. IMPRESSION: Evaluation is limited by motion, which affects nearly all sequences. Within this limitation, no acute intracranial process is seen.  Electronically Signed   By: Merilyn Baba M.D.   On: 03/01/2021 23:57   DG Chest Portable 1 View  Result Date: 03/14/2021 CLINICAL DATA:  Tachycardia EXAM: PORTABLE CHEST 1 VIEW COMPARISON:  03/13/2021 FINDINGS: Single frontal view of the chest demonstrates a  stable cardiac silhouette. Worsening volume status, with increased central vascular congestion, progressive interstitial prominence, and enlarging left pleural effusion. Dense left basilar consolidation could reflect airspace disease or atelectasis. No pneumothorax. No acute bony abnormalities. IMPRESSION: 1. Worsening volume status, with interval development of mild pulmonary edema and enlarging left pleural effusion. Electronically Signed   By: Randa Ngo M.D.   On: 03/14/2021 22:20   DG Chest Portable 1 View  Result Date: 03/13/2021 CLINICAL DATA:  Left pleural effusion.  Status post fall. EXAM: PORTABLE CHEST 1 VIEW COMPARISON:  03/01/2021 FINDINGS: Stable cardiomediastinal contours. Small left pleural effusion is identified, similar to previous exam. Pulmonary vascular congestion noted. No airspace opacities. No focal bone abnormality. IMPRESSION: 1. Stable small left pleural effusion. 2. Pulmonary vascular congestion. Correlate for clinical signs or symptoms of congestive heart failure Electronically Signed   By: Kerby Moors M.D.   On: 03/13/2021 08:10   DG Knee Complete 4 Views Left  Result Date: 03/13/2021 CLINICAL DATA:  80 year old female status post witnessed fall. EXAM: LEFT KNEE - COMPLETE 4+ VIEW COMPARISON:  None. FINDINGS: Osteopenia. Severe lateral compartment joint space loss. Bulky tricompartmental degenerative spurring. Orthopedic staple hardware at the medial tibial metadiaphysis. No definite joint effusion. Patella appears intact. No acute osseous abnormality identified. Generalized subcutaneous soft tissue stranding. IMPRESSION: No acute fracture or dislocation identified about the left knee. Osteopenia with advanced degeneration, severe in the lateral compartment. Postoperative changes to the medial compartment. Electronically Signed   By: Genevie Ann M.D.   On: 03/13/2021 07:31   DG Knee Complete 4 Views Right  Result Date: 03/13/2021 CLINICAL DATA:  80 year old female status  post witnessed fall. EXAM: RIGHT KNEE - COMPLETE 4+ VIEW COMPARISON:  None. FINDINGS: Osteopenia. Moderate medial compartment joint space loss. Mild to moderate tricompartmental degenerative spurring. Patella appears intact. No joint effusion identified. No acute osseous abnormality identified. Calcified peripheral vascular disease. Generalized subcutaneous stranding. IMPRESSION: 1. Osteopenia. No acute fracture or dislocation identified about the right knee. 2. Medial compartment dominant degenerative changes. 3. Calcified peripheral vascular disease. Electronically Signed   By: Genevie Ann M.D.   On: 03/13/2021 07:30   DG Humerus Right  Result Date: 03/13/2021 CLINICAL DATA:  80 year old female status post witnessed fall. EXAM: RIGHT HUMERUS - 2+ VIEW COMPARISON:  None. FINDINGS: Grossly preserved alignment at the right shoulder. Angulated, healed right humeral shaft fracture with midshaft callus. The distal humerus at the elbow appears abnormal. See dedicated elbow series. Visible right ribs appear intact. IMPRESSION: 1. Distal right humerus appears abnormal. See dedicated Right Elbow Series. 2. Chronic right midshaft humerus fracture. Maintained alignment at the right shoulder. Electronically Signed   By: Genevie Ann M.D.   On: 03/13/2021 07:32   DG Hand Complete Right  Result Date: 03/13/2021 CLINICAL DATA:  80 year old female status post witnessed fall. EXAM: RIGHT HAND - COMPLETE 3+ VIEW COMPARISON:  Right forearm series today. FINDINGS: Chronic ulnar styloid fracture. Distal radius and ulna otherwise appear intact. Carpal bone alignment and joint spaces maintained. Metacarpals appear intact. Widespread IP joint space loss and osteophytosis in keeping with osteoarthritis. No acute osseous abnormality identified. IMPRESSION: 1. No acute fracture or dislocation identified about the right hand. 2. Osteoarthritis.  Chronic ulnar styloid  fracture. Electronically Signed   By: Genevie Ann M.D.   On: 03/13/2021 07:37     Microbiology: Recent Results (from the past 240 hour(s))  Resp Panel by RT-PCR (Flu A&B, Covid) Nasopharyngeal Swab     Status: Abnormal   Collection Time: 03/13/21 10:59 AM   Specimen: Nasopharyngeal Swab; Nasopharyngeal(NP) swabs in vial transport medium  Result Value Ref Range Status   SARS Coronavirus 2 by RT PCR POSITIVE (A) NEGATIVE Final    Comment: RCRV DENISE RHEW 03/13/21 1437 MW (NOTE) SARS-CoV-2 target nucleic acids are DETECTED.  The SARS-CoV-2 RNA is generally detectable in upper respiratory specimens during the acute phase of infection. Positive results are indicative of the presence of the identified virus, but do not rule out bacterial infection or co-infection with other pathogens not detected by the test. Clinical correlation with patient history and other diagnostic information is necessary to determine patient infection status. The expected result is Negative.  Fact Sheet for Patients: EntrepreneurPulse.com.au  Fact Sheet for Healthcare Providers: IncredibleEmployment.be  This test is not yet approved or cleared by the [place] FDA and  has been authorized for detection and/or diagnosis of SARS-CoV-2 by FDA under an Emergency Use Authorization (EUA).  This EUA will remain in effect (meaning this test can be used) for the duration of  the COVID-19 declarati on under Section 564(b)(1) of the Act, 21 U.S.C. section 360bbb-3(b)(1), unless the authorization is terminated or revoked sooner.     Influenza A by PCR NEGATIVE NEGATIVE Final   Influenza B by PCR NEGATIVE NEGATIVE Final    Comment: (NOTE) The Xpert Xpress SARS-CoV-2/FLU/RSV plus assay is intended as an aid in the diagnosis of influenza from Nasopharyngeal swab specimens and should not be used as a sole basis for treatment. Nasal washings and aspirates are unacceptable for Xpert Xpress SARS-CoV-2/FLU/RSV testing.  Fact Sheet for  Patients: EntrepreneurPulse.com.au  Fact Sheet for Healthcare Providers: IncredibleEmployment.be  This test is not yet approved or cleared by the [place] FDA and has been authorized for detection and/or diagnosis of SARS-CoV-2 by FDA under an Emergency Use Authorization (EUA). This EUA will remain in effect (meaning this test can be used) for the duration of the COVID-19 declaration under Section 564(b)(1) of the Act, 21 U.S.C. section 360bbb-3(b)(1), unless the authorization is terminated or revoked.  Performed at [facility], [address] [place], [place] [address]      Labs: CBC: Recent Labs  Lab 03/12/21 1003 03/13/21 0604 03/13/21 1857 03/14/21 0705 03/14/21 1352 03/15/21 0520 03/15/21 1404 03/15/21 1556 03/15/21 2144 03/16/21 0529  WBC 4.5 5.4  --  3.7*  --  4.1  --   --   --  4.5  NEUTROABS 3.5 4.1  --   --   --   --   --   --   --   --   HGB 7.2* 6.4*   < > 7.3*   < > 6.7* 7.7* 7.8* 7.9* 9.2*  HCT 23.5* 21.9*   < > 22.8*   < > 20.6* 23.8* 23.9* 24.3* 27.8*  MCV 94.8 100.0  --  89.1  --  90.4  --   --   --  88.5  PLT 131* 150  --  133*  --  123*  --   --   --  136*   < > = values in this interval not displayed.   Basic Metabolic Panel: Recent Labs  Lab 03/12/21 1003 03/13/21 0604 03/13/21 0917 03/14/21 0705 03/15/21  0520 03/16/21 0529  NA 135 135  --  133* 129* 130*  K 4.0 4.5  --  4.3 4.8 4.5  CL 100 104  --  100 97* 98  CO2 26 21*  --  26 26 26   GLUCOSE 165* 116*  --  122* 167* 118*  BUN 36* 38*  --  40* 43* 44*  CREATININE 1.52* 1.42*  --  1.56* 1.86* 1.57*  CALCIUM 8.1* 8.0*  --  7.8* 7.9* 8.1*  MG  --   --  1.8  --  1.7 1.8  PHOS  --   --   --   --  3.6 3.5   Liver Function Tests: Recent Labs  Lab 03/12/21 1003 03/13/21 0604 03/15/21 0520 03/16/21 0529  AST 25 24 17 16   ALT 13 10 11 12   ALKPHOS 61 48 52 54  BILITOT 1.3* 1.3* 1.3* 1.8*  PROT 5.5* 5.0* 4.6* 5.3*  ALBUMIN 2.1* 1.9*  2.3* 2.2*   No results for input(s): LIPASE, AMYLASE in the last 168 hours. Recent Labs  Lab 03/15/21 0520  AMMONIA 42*   Cardiac Enzymes: No results for input(s): CKTOTAL, CKMB, CKMBINDEX, TROPONINI in the last 168 hours. BNP (last 3 results) Recent Labs    03/01/21 1819 03/13/21 0917  BNP 209.6* 130.7*   CBG: Recent Labs  Lab 03/15/21 1148 03/15/21 1725 03/15/21 2104 03/16/21 0803 03/16/21 1147  GLUCAP 202* 213* 192* 92 164*    Time spent: 35 minutes  Signed:  Val Riles  [facility]  03/16/2021 12:39 PM

## 2021-03-16 NOTE — TOC Transition Note (Signed)
Transition of Care [facility]) - CM/SW Discharge Note   Patient Details  Name: April Blake MRN: [id number] Date of Birth: 1941-01-12  Transition of Care [facility]) CM/SW Contact:  Beverly Sessions, RN Phone Number: 03/16/2021, 2:13 PM   Clinical Narrative:    Patient to discharge to hospice home today Hospice has coordinated for family to complete paperwork, and arranged EMS transport   EMS packet printed.  MD to sign DNR   Final next level of care: Other (comment) ([facility] in [place]) Barriers to Discharge: Continued Medical Work up   Patient Goals and CMS Choice     Choice offered to / list presented to : Adult Children  Discharge Placement                       Discharge Plan and Services   Discharge Planning Services: CM Consult Post Acute Care Choice: Residential Hospice Bed                               Social Determinants of Health (SDOH) Interventions     Readmission Risk Interventions No flowsheet data found.

## 2021-03-16 NOTE — Progress Notes (Addendum)
Manufacturing engineer [facility]) Hospital Liaison Note  Received request from Transitions of Care Manager Cephus Richer., RN  for family interest in [facility]. Visited patient at bedside and spoke with patient and daughter/Ginger at bedside to confirm interest and explain services.  Approval for Hospice Home is determined by [facility] MD. Once [facility] MD has determined Hospice Home eligibility, [place] will update hospital staff and family.  Addendum: Patient has been approved for [facility] and transport has been arranged for 4PM with [facility] by MSW.    Please send signed DNR form with patient and RN call report to [phone].   Please do not hesitate to call with any hospice related questions.    Thank you for the opportunity to participate in this patient's care.  Daphene Calamity, MSW [facility] Liaison  [phone]

## 2021-03-16 NOTE — Progress Notes (Signed)
OT Cancellation Note  Patient Details Name: April Blake MRN: [id number] DOB: 07-15-1940   Cancelled Treatment:    Reason Eval/Treat Not Completed: OT screened, no needs identified, will sign off (per chart review and MD note, hospice team consulted for placement in hospice facility and will be discharged to a facility when bed becomes available. OT will sign off at this time. Please re-consult if there is a functional change in status.)  Shanon Payor, OTD OTR/L  03/16/21, 8:13 AM

## 2021-03-17 ENCOUNTER — Telehealth: Payer: Self-pay | Admitting: Oncology

## 2021-03-17 NOTE — Telephone Encounter (Signed)
Daughter called to cancel pt's appts. Pt is going to hospice. Daughter wants to thank everyone for taking care of her mother.

## 2021-03-18 ENCOUNTER — Inpatient Hospital Stay: Payer: Medicare Other | Admitting: Nurse Practitioner

## 2021-03-18 ENCOUNTER — Inpatient Hospital Stay: Payer: Medicare Other | Admitting: Family Medicine

## 2021-03-19 ENCOUNTER — Other Ambulatory Visit: Payer: Medicare Other | Admitting: Student

## 2021-04-05 DEATH — deceased

## 2021-04-13 ENCOUNTER — Other Ambulatory Visit: Payer: Medicare Other

## 2021-04-14 ENCOUNTER — Ambulatory Visit: Payer: Medicare Other

## 2021-04-14 ENCOUNTER — Ambulatory Visit: Payer: Medicare Other | Admitting: Oncology

## 2021-04-21 ENCOUNTER — Telehealth: Payer: Self-pay | Admitting: *Deleted

## 2021-04-21 ENCOUNTER — Telehealth: Payer: Self-pay

## 2021-04-21 NOTE — Telephone Encounter (Signed)
Ginger called and wanted Dr Grayland Ormond to know that patient passed away and to thank him for the care given to her mother. She states that it meant the world to her mother that he came to see her while in the hospital.

## 2021-04-21 NOTE — Telephone Encounter (Signed)
Copied from [place] [phone]. Topic: General - Other >> Apr 21, 2021 10:22 AM Valere Dross wrote: Reason for CRM: Pts daughter called in stating pt has passed away. And the family wanted to give there thanks for the care that he has giving, and appreciated all that Dr. Raliegh Ip has done for her, and to have all her future appts canceled.

## 2022-06-29 IMAGING — CT CT CERVICAL SPINE W/O CM
3 of 4 series · 12 of 33 positions shown, 14 images · non-contrast
Comparison: Brain MRI and chest radiographs 03/01/2021.

CLINICAL DATA: 80-year-old female status post witnessed fall.

EXAM:
CT CERVICAL SPINE WITHOUT CONTRAST
TECHNIQUE: Multidetector CT imaging of the cervical spine was performed without
intravenous contrast. Multiplanar CT image reconstructions were also
generated.

[Series 4: sagittal bone · sagittal · 0.37mm/px · 5 of 80 slices shown, 6 images]
[im 27/80  bone]
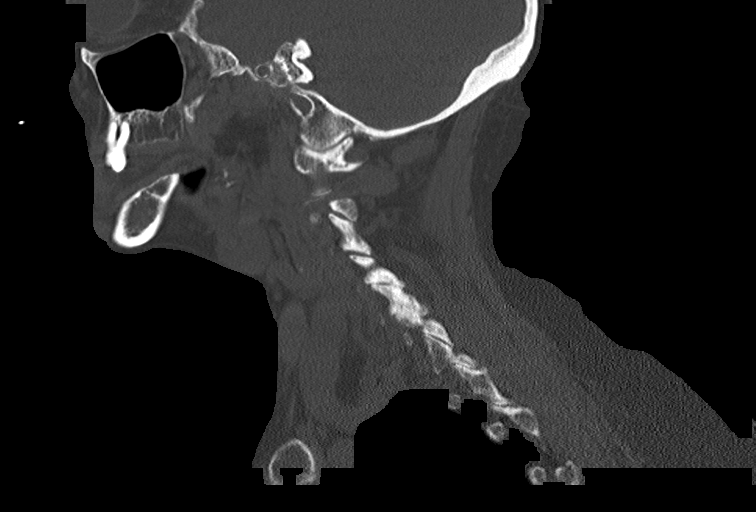
[im 33/80  bone]
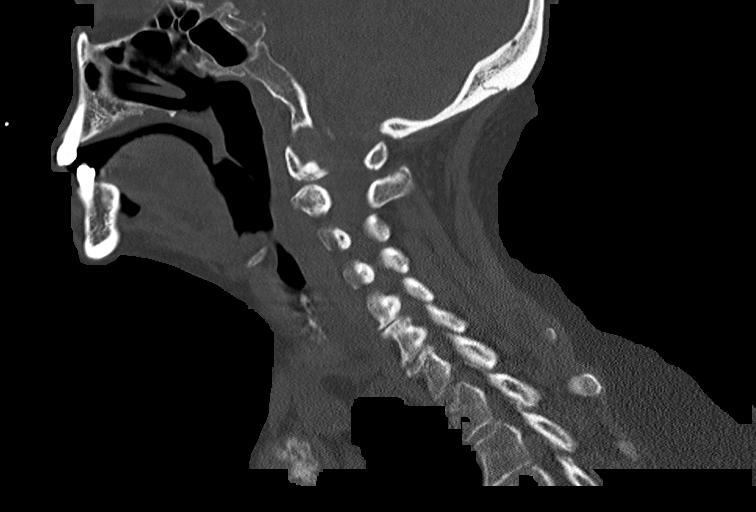
[im 40/80  soft-tissue]
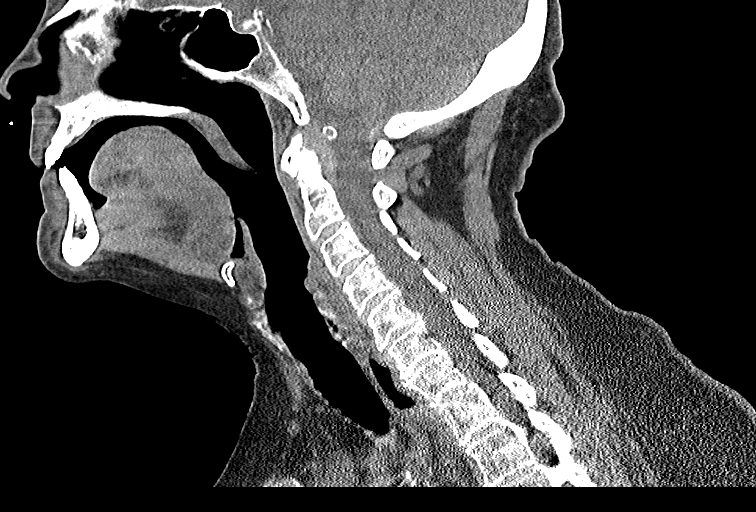
[im 40/80  bone]
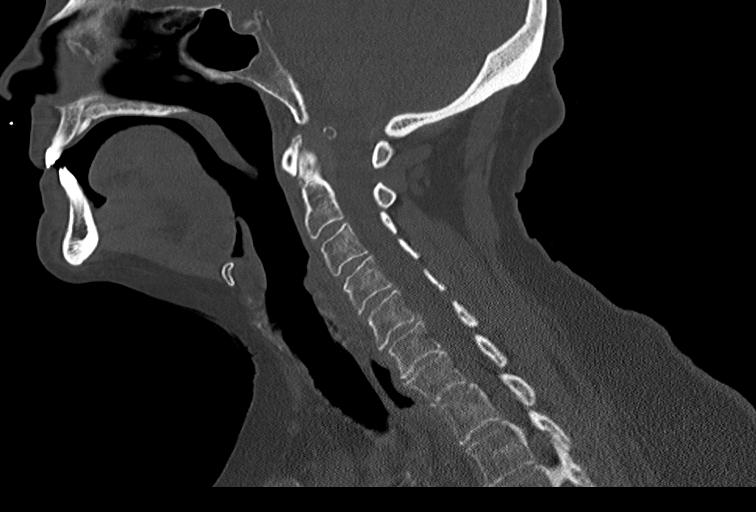
[im 47/80  bone]
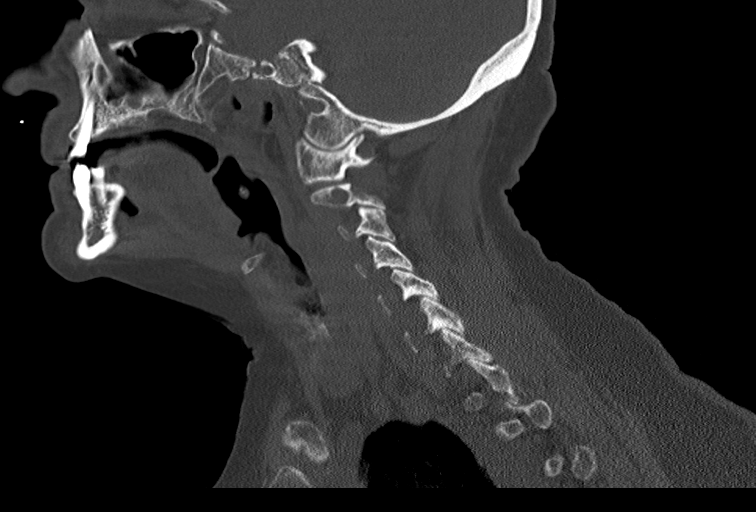
[im 53/80  bone]
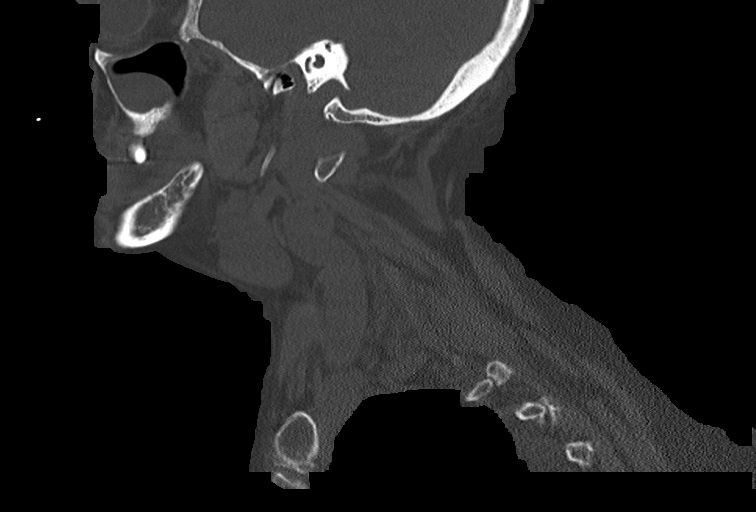

[Series 5: coronal bone · coronal · 0.42mm/px · 3 of 68 slices shown]
[im 19/68  bone]
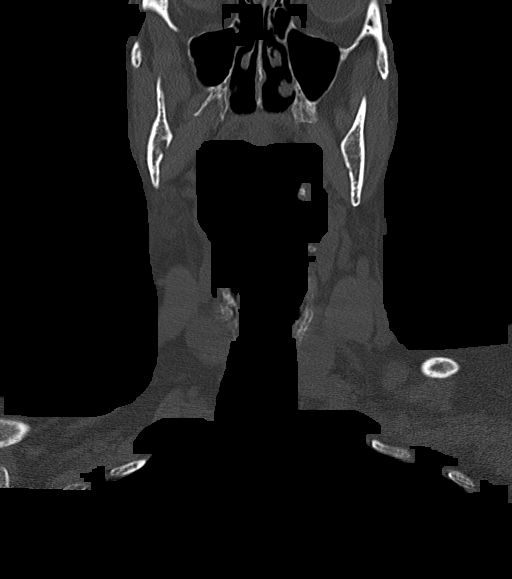
[im 29/68  bone]
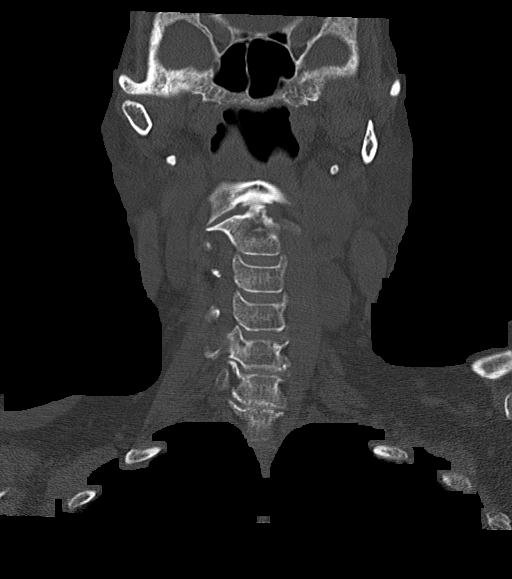
[im 39/68  bone]
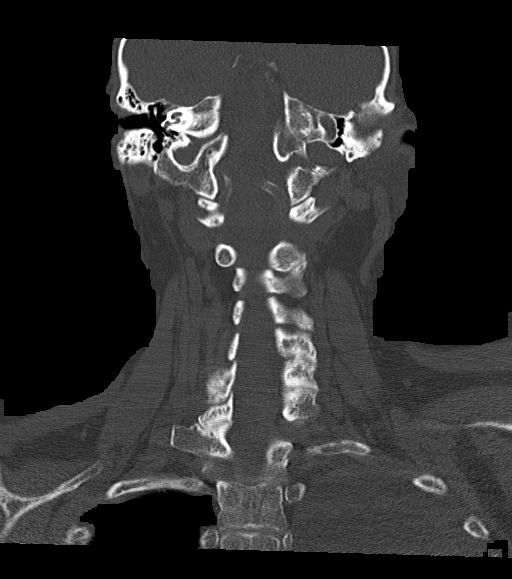

[Series 6: orthogonal bone · axial · 0.35mm/px · z∈[-334,-184]mm · 4 of 130 slices shown, 5 images]
[im 19/130  soft-tissue]
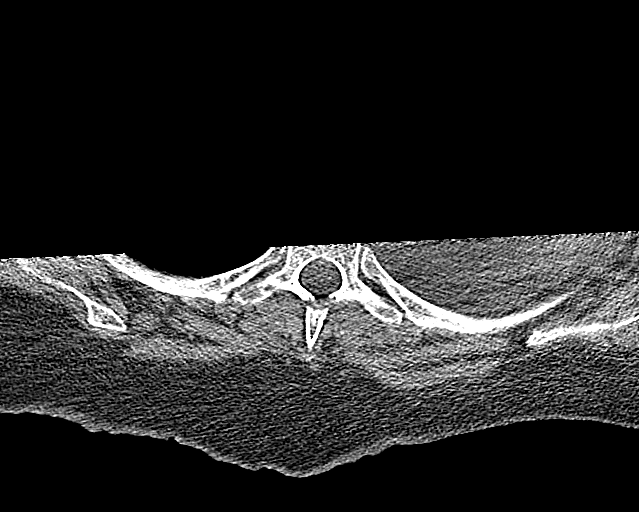
[im 19/130  bone]
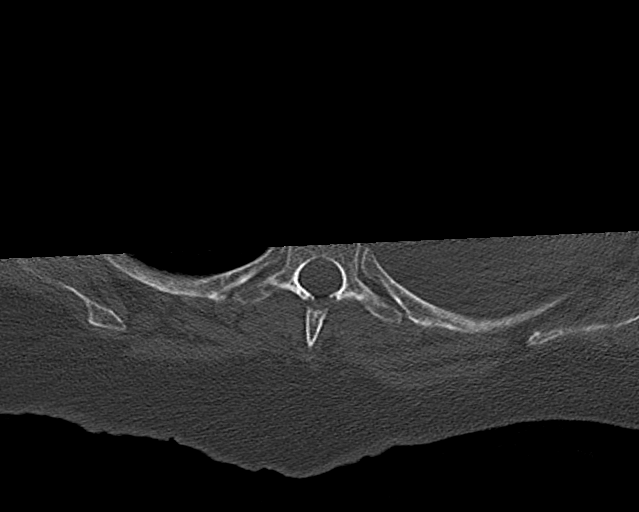
[im 56/130  bone]
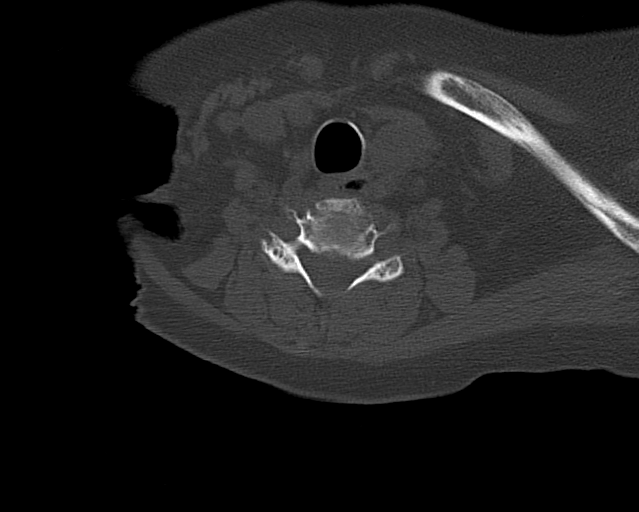
[im 74/130  bone]
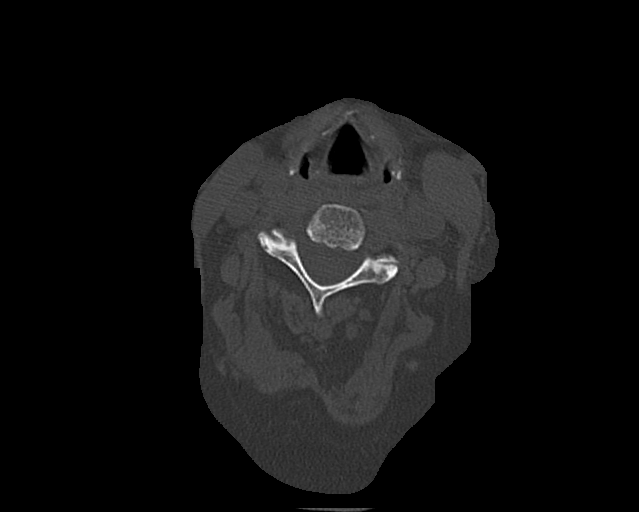
[im 111/130  bone]
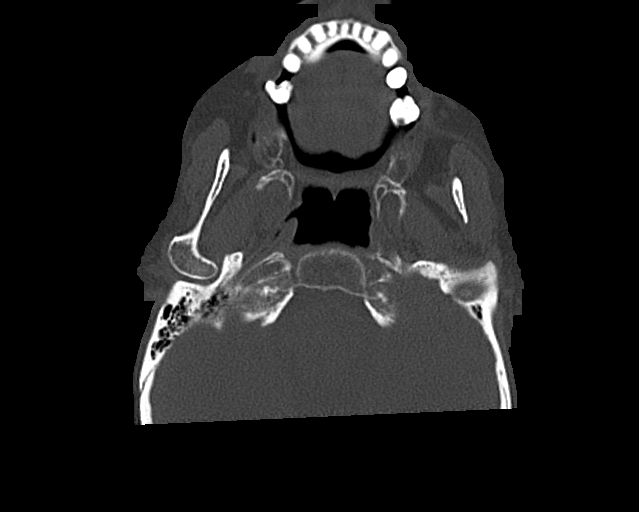

[12 of 33 positions shown; findings below may reference images not displayed]

FINDINGS: Alignment: Straightening of cervical lordosis. Bilateral posterior
element alignment is within normal limits. Cervicothoracic junction
alignment is within normal limits.

Skull base and vertebrae: Visualized skull base is intact. No
atlanto-occipital dissociation. C1 and C2 appear intact and aligned.
No acute osseous abnormality identified.

Soft tissues and spinal canal: No prevertebral fluid or swelling. No
visible canal hematoma. Negative noncontrast visible neck soft
tissues.

Disc levels:  Mild for age cervical spine degeneration.

Upper chest: At least moderate layering left pleural effusion,
questionable mildly complex fluid density. A small left pleural
effusion was present last month. No left pneumothorax or apical lung
contusion. Grossly intact visible upper thoracic levels.

Other: Absent and carious dentition.
IMPRESSION: 1. No acute traumatic injury identified in the cervical spine.
2. Layering pleural effusion in the left lung apex appears to be at
least moderate in volume. Recommend follow-up Chest imaging.

## 2022-06-29 IMAGING — CR DG KNEE COMPLETE 4+V*L*
1 series · 4 of 4 positions shown · non-contrast
Comparison: None.

CLINICAL DATA: 80-year-old female status post witnessed fall.

EXAM:
LEFT KNEE - COMPLETE 4+ VIEW

[Series 1: dg knee complete 4 views left · 0.14mm/px · 4 of 4 slices shown]
[im 1/4]
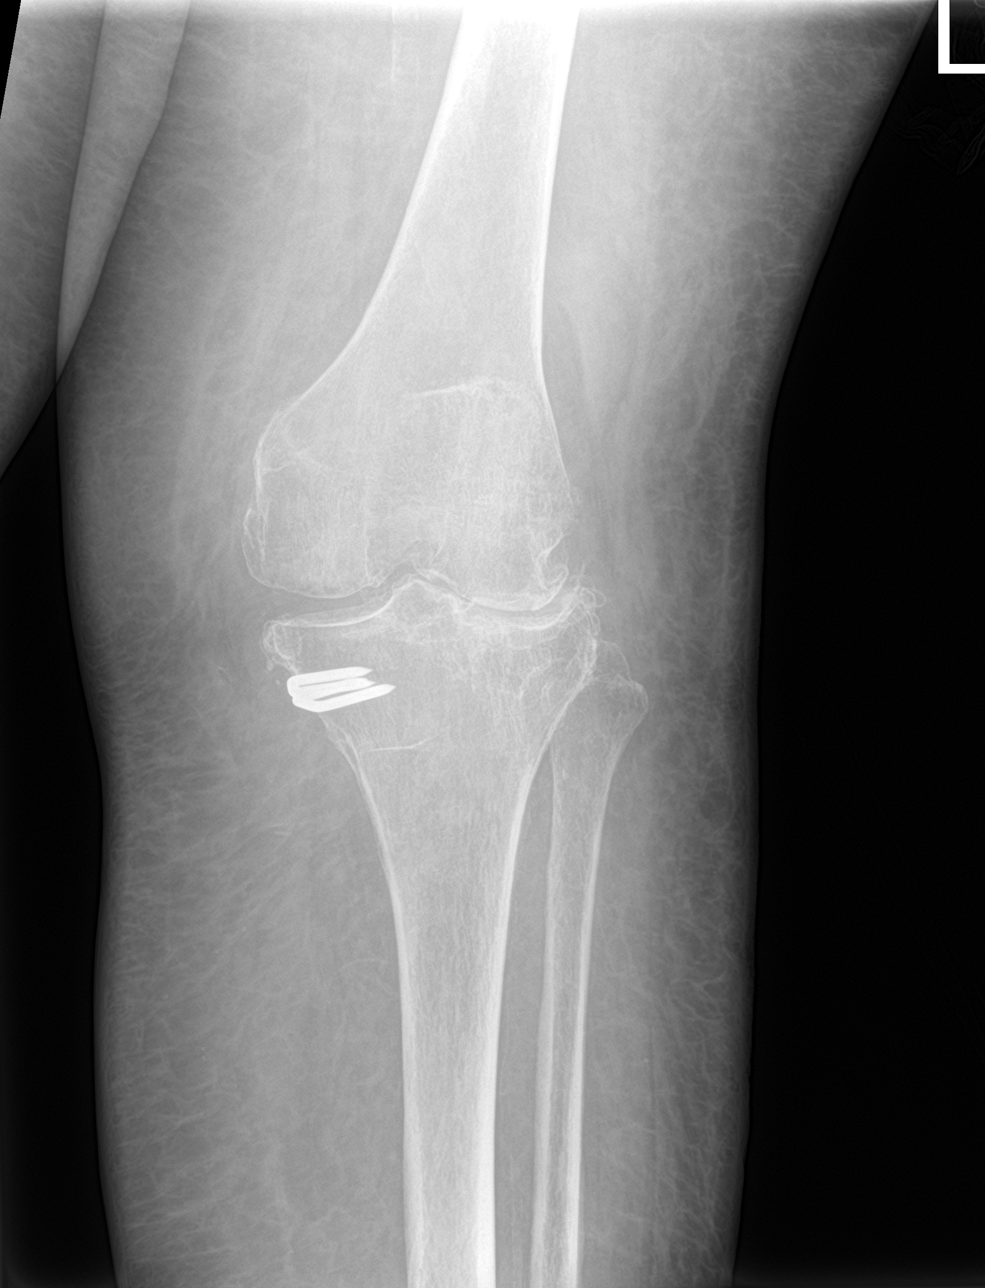
[im 2/4]
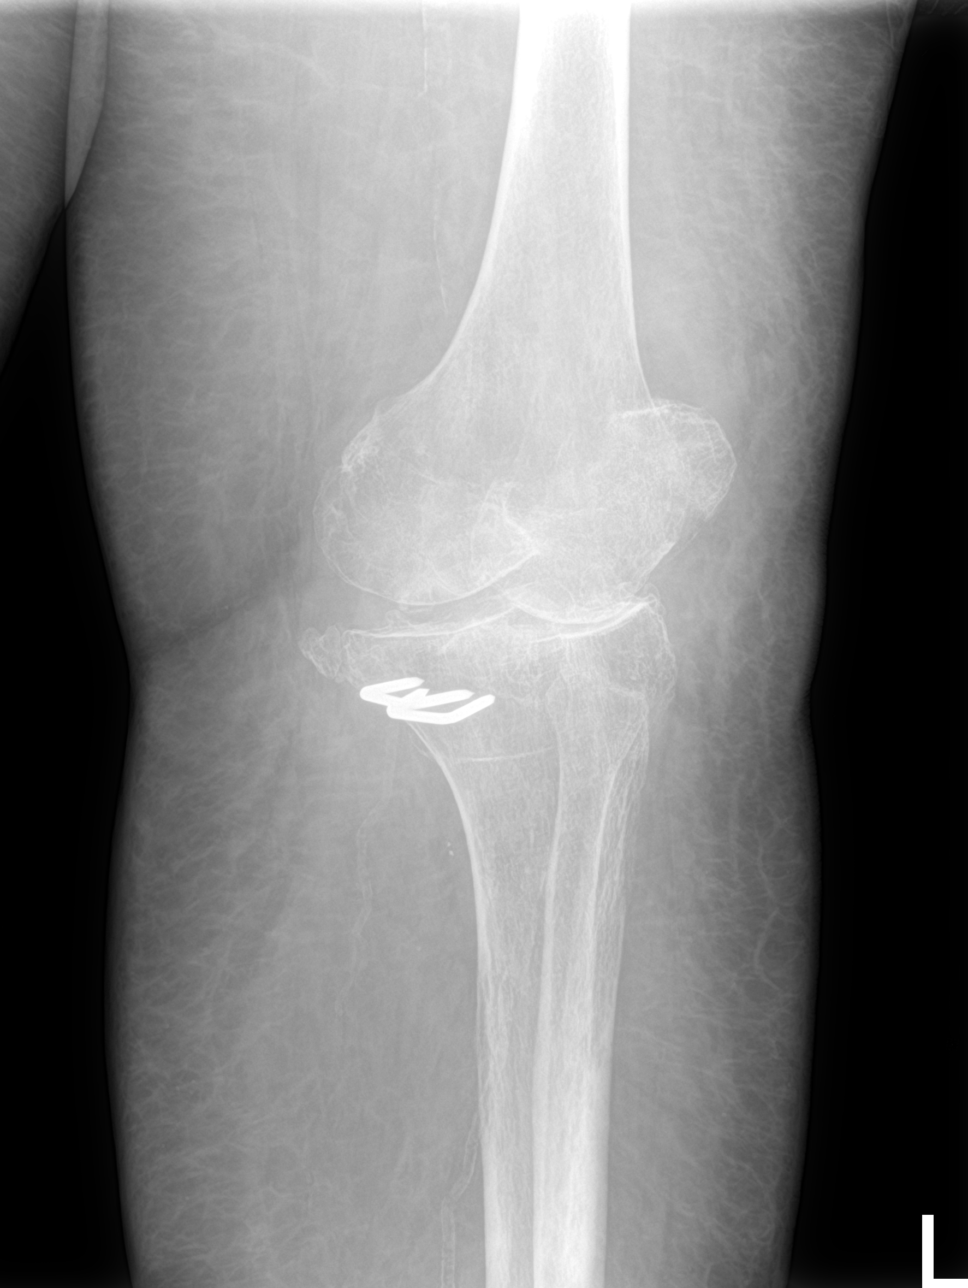
[im 3/4]
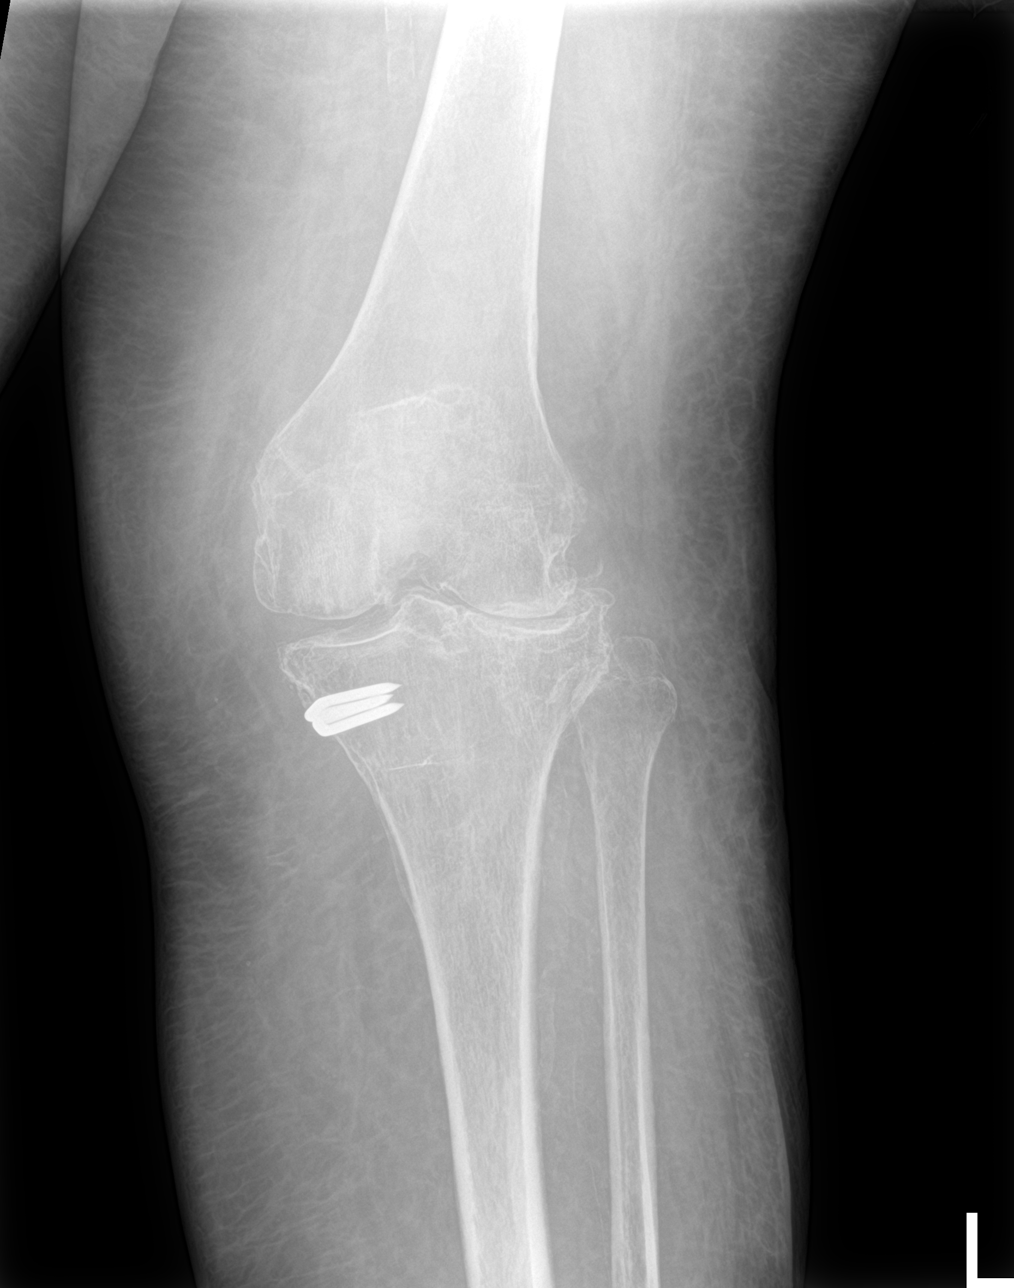
[im 4/4]
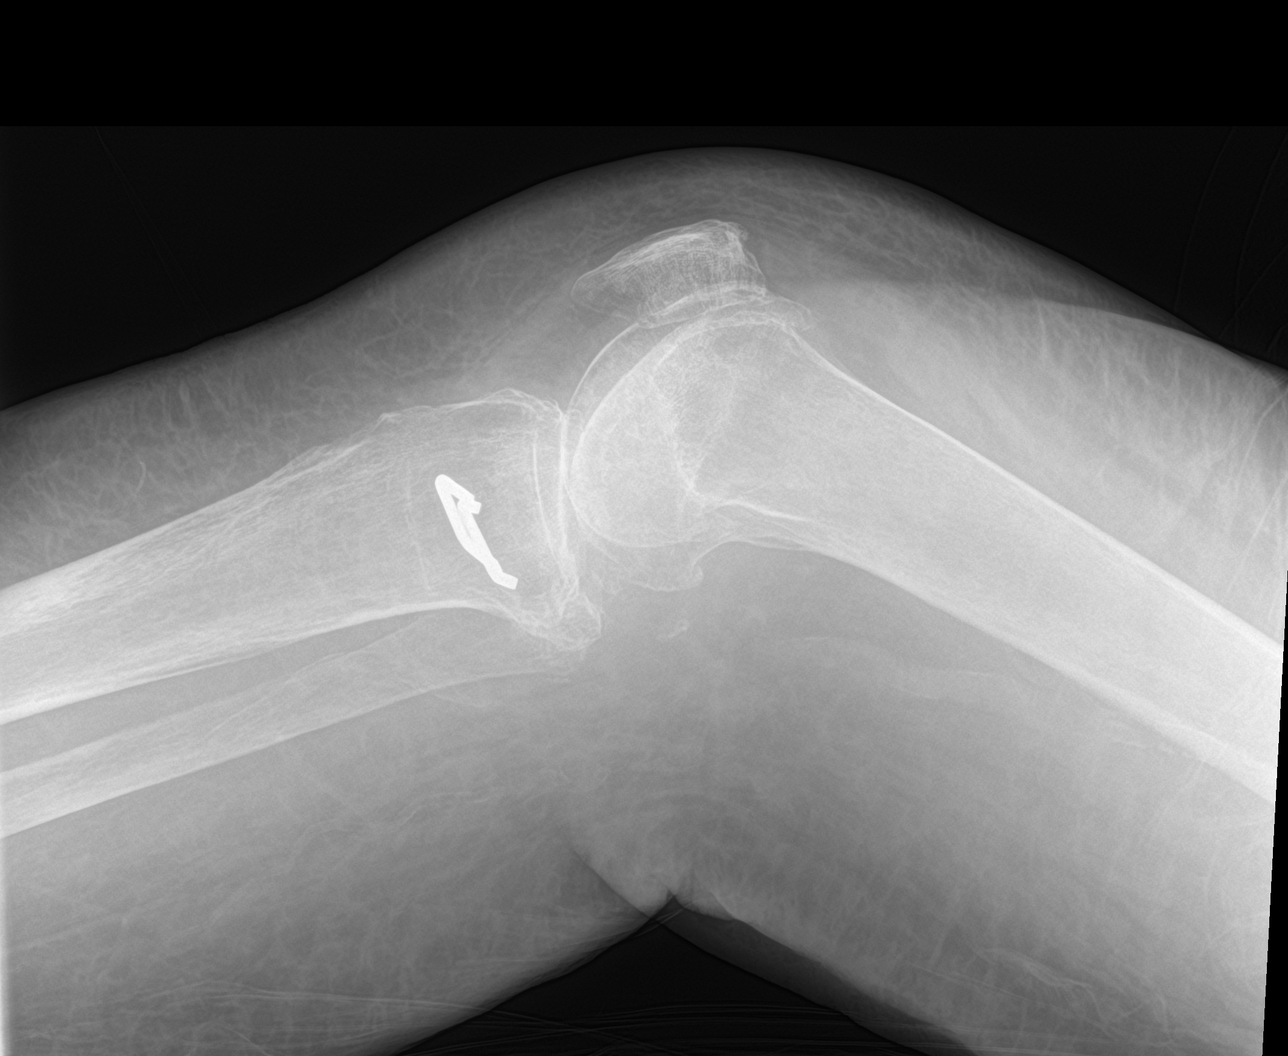

[4 of 4 positions shown; findings below may reference images not displayed]

FINDINGS: Osteopenia. Severe lateral compartment joint space loss. Bulky
tricompartmental degenerative spurring. Orthopedic staple hardware
at the medial tibial metadiaphysis. No definite joint effusion.
Patella appears intact. No acute osseous abnormality identified.
Generalized subcutaneous soft tissue stranding.
IMPRESSION: No acute fracture or dislocation identified about the left knee.
Osteopenia with advanced degeneration, severe in the lateral
compartment. Postoperative changes to the medial compartment.

## 2022-06-29 IMAGING — CT CT HEAD W/O CM
4 series · 16 of 47 positions shown, 18 images · non-contrast
Comparison: Head CT and Brain MRI 03/01/2021.

CLINICAL DATA: 80-year-old female status post witnessed fall.

EXAM:
CT HEAD WITHOUT CONTRAST
TECHNIQUE: Contiguous axial images were obtained from the base of the skull
through the vertex without intravenous contrast.

[Series 2: head wo · axial · 0.41mm/px · z∈[-128,-8]mm · 7 of 32 slices shown, 9 images]
[im 4/32  brain]
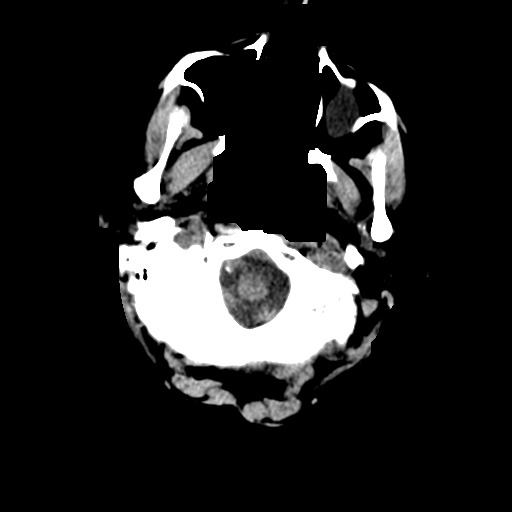
[im 4/32  bone]
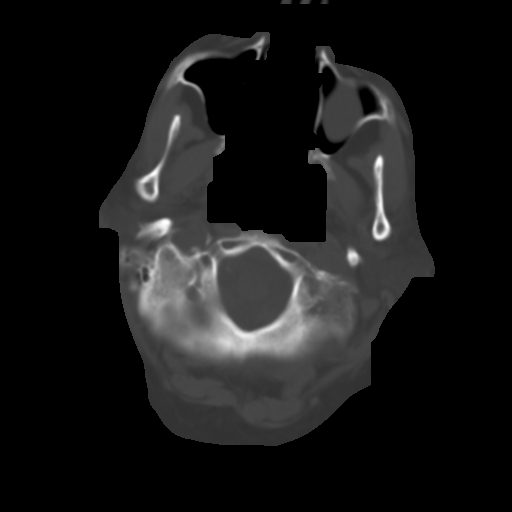
[im 8/32  brain]
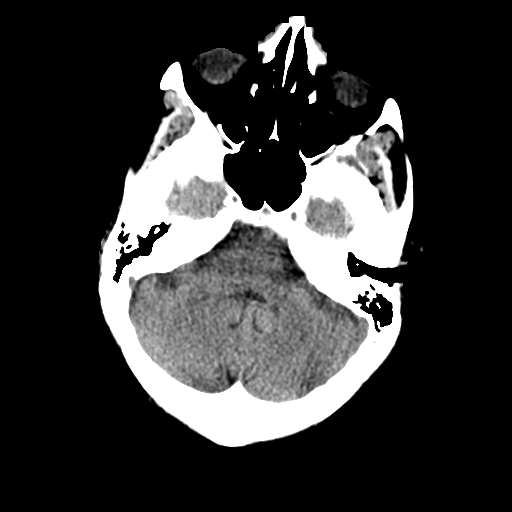
[im 12/32  brain]
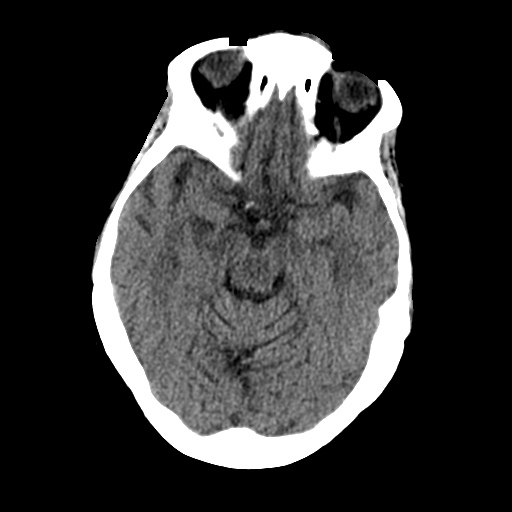
[im 16/32  brain]
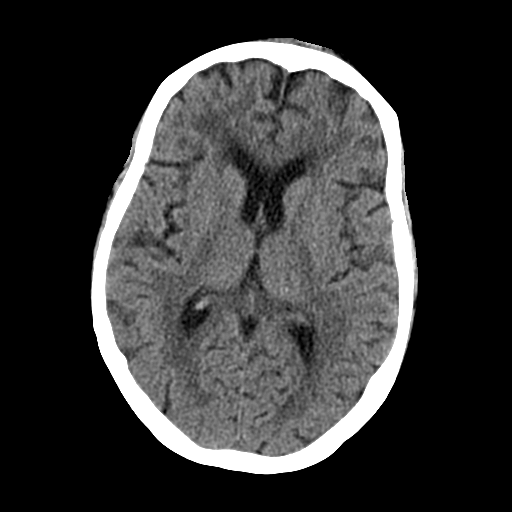
[im 20/32  brain]
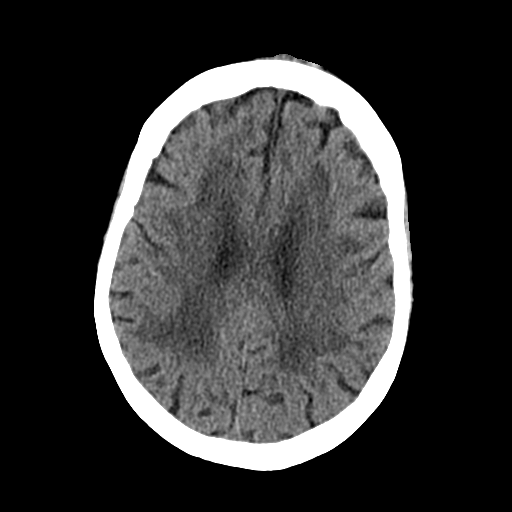
[im 20/32  bone]
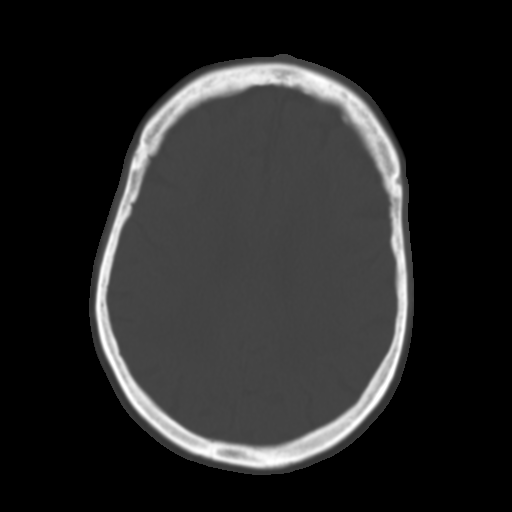
[im 24/32  brain]
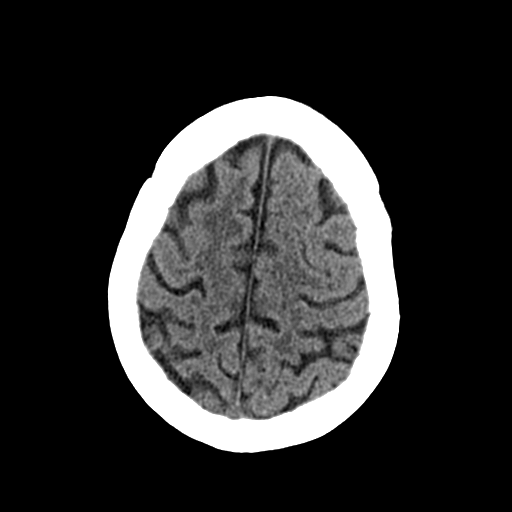
[im 28/32  brain]
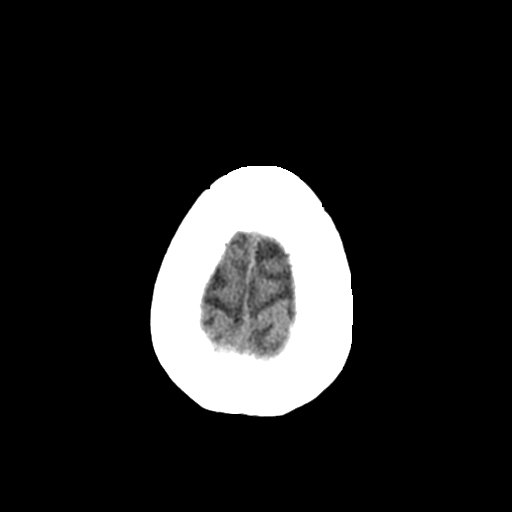

[Series 3: head bone · axial · 0.41mm/px · z∈[-129,-97]mm · 3 of 79 slices shown]
[im 8/79  bone]
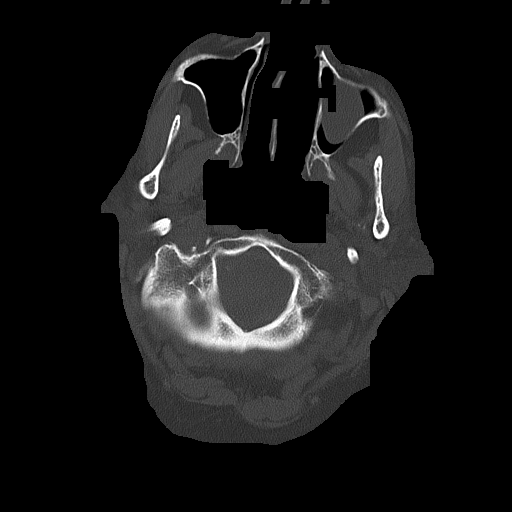
[im 16/79  bone]
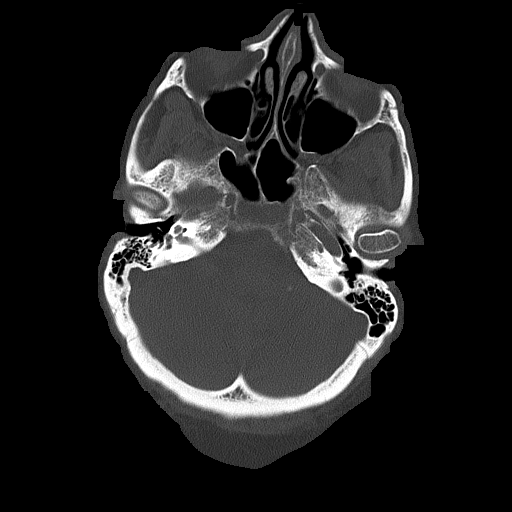
[im 24/79  bone]
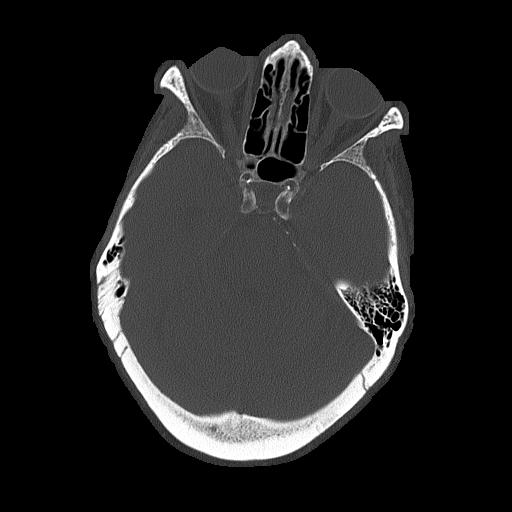

[Series 4: coronal soft tissue · coronal · 0.32mm/px · 3 of 69 slices shown]
[im 23/69  brain]
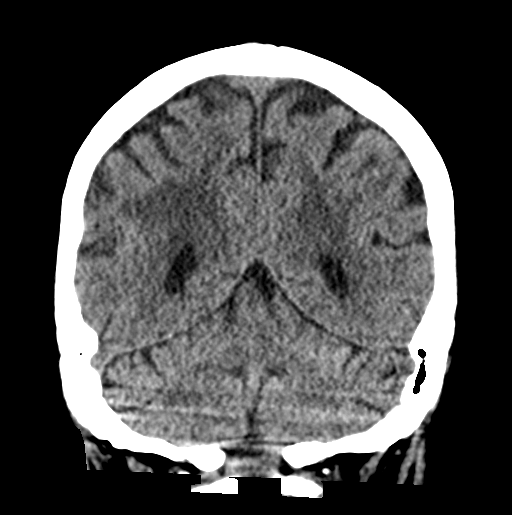
[im 31/69  brain]
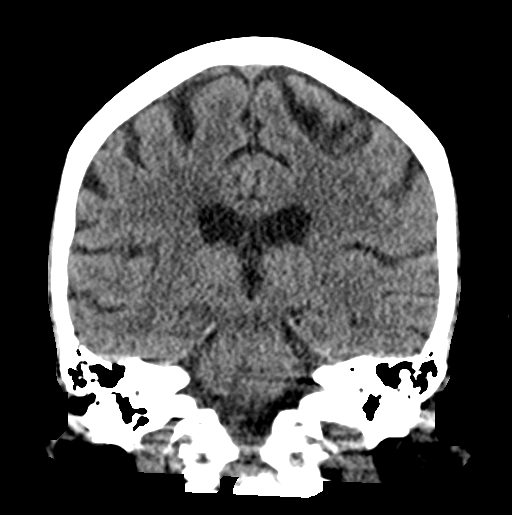
[im 38/69  brain]
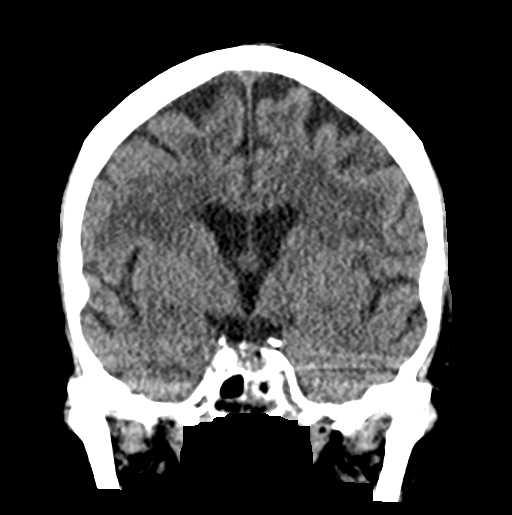

[Series 5: sagittal soft tissue · sagittal · 0.32mm/px · 3 of 54 slices shown]
[im 18/54  brain]
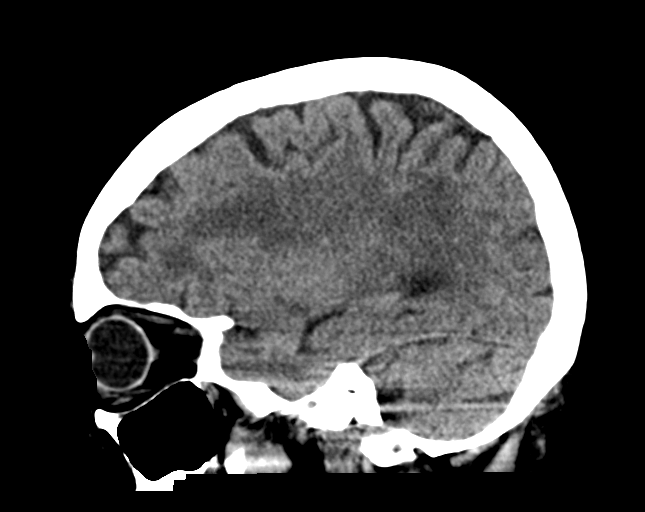
[im 27/54  brain]
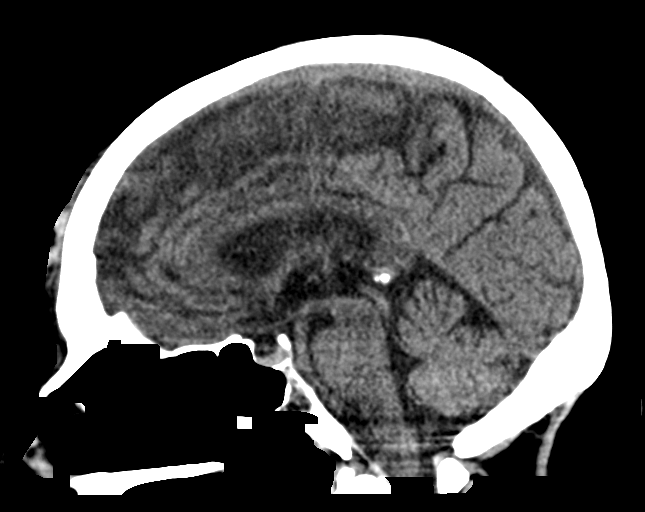
[im 36/54  brain]
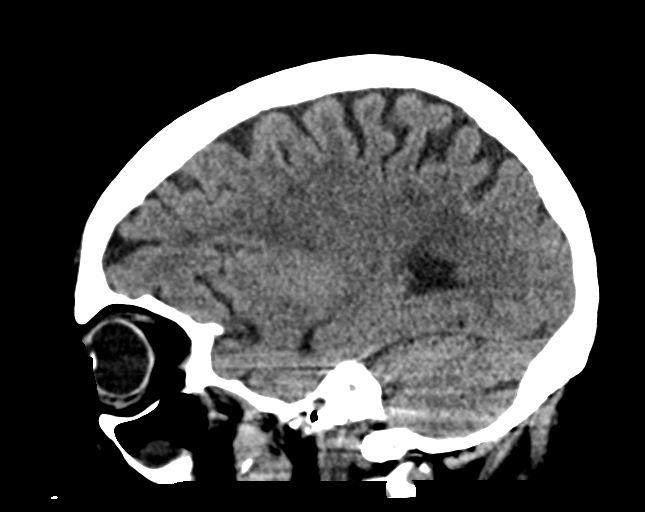

[16 of 47 positions shown; findings below may reference images not displayed]

FINDINGS: Brain: Stable cerebral volume. No midline shift, ventriculomegaly,
mass effect, evidence of mass lesion, intracranial hemorrhage or
evidence of cortically based acute infarction. Patchy and confluent
bilateral white matter hypodensity appears stable from last month.
No cortical encephalomalacia identified.

Vascular: Extensive Calcified atherosclerosis at the skull base. No
suspicious intracranial vascular hyperdensity.

Skull: Stable.  No acute osseous abnormality identified.

Sinuses/Orbits: Left maxillary sinus mucous retention cyst is
stable. Other Visualized paranasal sinuses and mastoids are stable
and well aerated.

Other: Broad-based forehead scalp hematoma. No scalp soft tissue
gas. Underlying frontal bones appear stable and intact. Orbits soft
tissues appears stable.
IMPRESSION: 1. Broad-based forehead scalp hematoma without underlying skull
fracture.
2. Stable from last month non contrast CT appearance of advanced
cerebral white matter changes.

## 2022-06-30 IMAGING — DX DG CHEST 1V PORT
1 series · 1 of 1 positions shown · non-contrast
Comparison: 03/13/2021

CLINICAL DATA: Tachycardia

EXAM:
PORTABLE CHEST 1 VIEW

[chest ap]
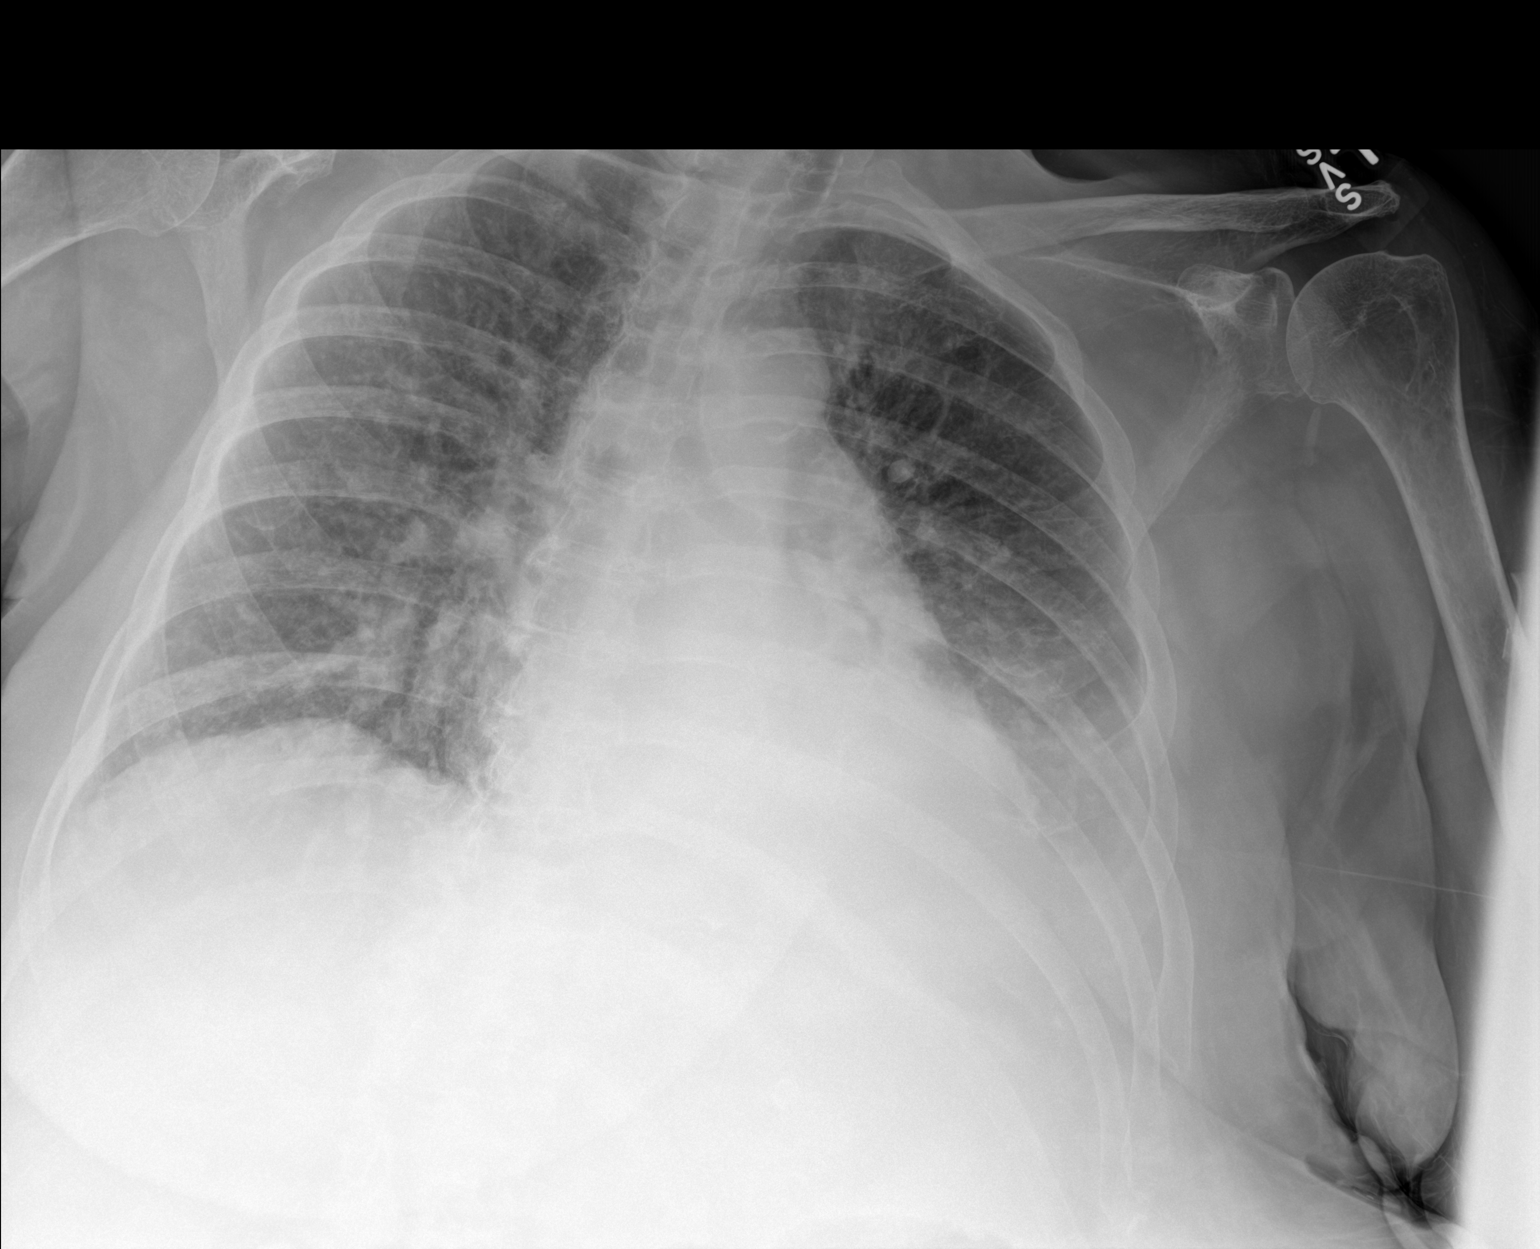

[1 of 1 positions shown; findings below may reference images not displayed]

FINDINGS: Single frontal view of the chest demonstrates a stable cardiac
silhouette. Worsening volume status, with increased central vascular
congestion, progressive interstitial prominence, and enlarging left
pleural effusion. Dense left basilar consolidation could reflect
airspace disease or atelectasis. No pneumothorax. No acute bony
abnormalities.
IMPRESSION: 1. Worsening volume status, with interval development of mild
pulmonary edema and enlarging left pleural effusion.
